# Patient Record
Sex: Male | Born: 1945 | Race: White | Hispanic: No | Marital: Single | Smoking: Former smoker
Health system: Southern US, Community
[De-identification: ages and names within clinical notes are randomized; demographics above are authoritative.]

## PROBLEM LIST (undated history)

## (undated) DIAGNOSIS — I2089 Other forms of angina pectoris: Secondary | ICD-10-CM

## (undated) DIAGNOSIS — I10 Essential (primary) hypertension: Secondary | ICD-10-CM

## (undated) DIAGNOSIS — M1990 Unspecified osteoarthritis, unspecified site: Secondary | ICD-10-CM

## (undated) DIAGNOSIS — E119 Type 2 diabetes mellitus without complications: Secondary | ICD-10-CM

## (undated) DIAGNOSIS — K219 Gastro-esophageal reflux disease without esophagitis: Secondary | ICD-10-CM

## (undated) DIAGNOSIS — E785 Hyperlipidemia, unspecified: Secondary | ICD-10-CM

## (undated) DIAGNOSIS — I208 Other forms of angina pectoris: Secondary | ICD-10-CM

## (undated) HISTORY — DX: Essential (primary) hypertension: I10

## (undated) HISTORY — DX: Other forms of angina pectoris: I20.89

## (undated) HISTORY — DX: Type 2 diabetes mellitus without complications: E11.9

## (undated) HISTORY — PX: PILONIDAL CYST EXCISION: SHX744

## (undated) HISTORY — DX: Gastro-esophageal reflux disease without esophagitis: K21.9

## (undated) HISTORY — DX: Other forms of angina pectoris: I20.8

## (undated) HISTORY — DX: Unspecified osteoarthritis, unspecified site: M19.90

## (undated) HISTORY — DX: Hyperlipidemia, unspecified: E78.5

## (undated) HISTORY — PX: KNEE ARTHROSCOPY: SUR90

---

## 2004-01-31 ENCOUNTER — Ambulatory Visit (HOSPITAL_COMMUNITY): Admission: RE | Admit: 2004-01-31 | Discharge: 2004-01-31 | Payer: Self-pay | Admitting: Orthopedic Surgery

## 2004-02-06 ENCOUNTER — Ambulatory Visit (HOSPITAL_COMMUNITY): Admission: RE | Admit: 2004-02-06 | Discharge: 2004-02-06 | Payer: Self-pay | Admitting: Orthopedic Surgery

## 2017-09-27 DIAGNOSIS — I10 Essential (primary) hypertension: Secondary | ICD-10-CM | POA: Insufficient documentation

## 2017-09-27 DIAGNOSIS — M130 Polyarthritis, unspecified: Secondary | ICD-10-CM | POA: Insufficient documentation

## 2017-09-27 DIAGNOSIS — Z79899 Other long term (current) drug therapy: Secondary | ICD-10-CM

## 2017-09-27 DIAGNOSIS — M1711 Unilateral primary osteoarthritis, right knee: Secondary | ICD-10-CM | POA: Insufficient documentation

## 2017-09-27 DIAGNOSIS — M1712 Unilateral primary osteoarthritis, left knee: Secondary | ICD-10-CM

## 2017-09-27 DIAGNOSIS — E119 Type 2 diabetes mellitus without complications: Secondary | ICD-10-CM | POA: Insufficient documentation

## 2017-09-27 DIAGNOSIS — G894 Chronic pain syndrome: Secondary | ICD-10-CM

## 2017-09-27 DIAGNOSIS — L409 Psoriasis, unspecified: Secondary | ICD-10-CM | POA: Insufficient documentation

## 2017-09-27 DIAGNOSIS — Z6836 Body mass index (BMI) 36.0-36.9, adult: Secondary | ICD-10-CM | POA: Insufficient documentation

## 2017-09-27 DIAGNOSIS — E7801 Familial hypercholesterolemia: Secondary | ICD-10-CM | POA: Insufficient documentation

## 2017-09-27 DIAGNOSIS — M170 Bilateral primary osteoarthritis of knee: Secondary | ICD-10-CM | POA: Insufficient documentation

## 2017-09-27 NOTE — Addendum Note (Signed)
Addended by: Marlyn CorporalARLTON, CATHERINE A on: 09/27/2017 11:55 AM   Modules accepted: Orders

## 2017-09-28 ENCOUNTER — Encounter: Payer: Self-pay | Admitting: Cardiovascular Disease

## 2017-09-28 ENCOUNTER — Ambulatory Visit (INDEPENDENT_AMBULATORY_CARE_PROVIDER_SITE_OTHER): Payer: Medicare Other | Admitting: Cardiovascular Disease

## 2017-09-28 ENCOUNTER — Encounter: Payer: Self-pay | Admitting: *Deleted

## 2017-09-28 VITALS — BP 180/98 | HR 110 | Ht 67.5 in | Wt 208.0 lb

## 2017-09-28 DIAGNOSIS — E7849 Other hyperlipidemia: Secondary | ICD-10-CM

## 2017-09-28 DIAGNOSIS — E119 Type 2 diabetes mellitus without complications: Secondary | ICD-10-CM | POA: Diagnosis not present

## 2017-09-28 DIAGNOSIS — I10 Essential (primary) hypertension: Secondary | ICD-10-CM | POA: Diagnosis not present

## 2017-09-28 DIAGNOSIS — R0602 Shortness of breath: Secondary | ICD-10-CM | POA: Diagnosis not present

## 2017-09-28 DIAGNOSIS — R0609 Other forms of dyspnea: Secondary | ICD-10-CM

## 2017-09-28 MED ORDER — METOPROLOL TARTRATE 25 MG PO TABS: 25.0000 mg | ORAL_TABLET | Freq: Two times a day (BID) | ORAL | 3 refills | Status: DC

## 2017-09-28 NOTE — Progress Notes (Signed)
CARDIOLOGY CONSULT NOTE  Patient ID: Dustin Monroe MRN: [id number] DOB/AGE: December 12, 1945 72 y.o.  Admit date: (Not on file) Primary Physician: Samuel Jester, DO Referring Physician: Samuel Jester, DO  Reason for Consultation: Shortness of breath  HPI: Dustin Monroe is a 72 y.o. male who is being seen today for the evaluation of  shortness of breath at the request of Samuel Jester, DO.   I reviewed records provided by Dr. Charm Barges.  He has a history of hypertension, diabetes and hyperlipidemia.    He has apparently been experiencing significant exertional dyspnea.  It appears hydrochlorothiazide 25 mg was started at a visit with his PCP on 08/29/17 but I do not see this on his medication list.  His blood pressure is significantly elevated.  I reviewed labs performed on 04/18/17 which included the following: Hemoglobin 15.6, total cholesterol 144, triglycerides 143, HDL 38, LDL 78, BUN 12, creatinine 0.9.  ECG performed today which I personally interpreted demonstrated sinus tachycardia, 104 bpm.  Upon speaking with him, he said the hydrochlorothiazide was too costly.  He is currently taking losartan 100 mg daily.  His blood pressure is 180/98.  He said when he was mowing the lawn last summer breath.  He denies any chest pain and pressure.  He becomes dyspneic with activities such as unloading 40 pound bags of pellets.  He denies orthopnea, paroxysmal nocturnal dyspnea, and leg swelling.  He did not have symptoms like this 1 year ago.  Used to walk 3 miles daily but has been limited by both exertional dyspnea and bilateral knee osteoarthritis.   Family history: His father had bypass surgery in his 36s.  His mother had a "enlarged heart "and had troubles with a "fast heart rate" which required medications.  No Known Allergies  Current Outpatient Medications  Medication Sig Dispense Refill  . aspirin EC 81 MG tablet Take 81 mg by mouth daily.    Marland Kitchen atorvastatin  (LIPITOR) 20 MG tablet Take 20 mg by mouth daily at 6 PM.    . esomeprazole (NEXIUM) 20 MG packet Take 20 mg by mouth daily before breakfast.    . hydrochlorothiazide (HYDRODIURIL) 25 MG tablet Take 25 mg by mouth daily.    Marland Kitchen HYDROcodone-acetaminophen (NORCO) 10-325 MG tablet Take 1 tablet by mouth every 8 (eight) hours as needed.    . hydrOXYzine (ATARAX/VISTARIL) 25 MG tablet Take 25 mg by mouth 3 (three) times daily as needed.    . metFORMIN (GLUCOPHAGE) 1000 MG tablet Take 1,000 mg by mouth daily after supper.    . naproxen sodium (ALEVE) 220 MG tablet Take 220 mg by mouth at bedtime. Take 2 tabs at bedtime     No current facility-administered medications for this visit.     Past Medical History:  Diagnosis Date  . Angina effort (HCC)   . DJD (degenerative joint disease)   . DM (diabetes mellitus) (HCC)   . GERD (gastroesophageal reflux disease)   . HTN (hypertension)   . Hyperlipemia     Past Surgical History:  Procedure Laterality Date  . KNEE ARTHROSCOPY Bilateral   . PILONIDAL CYST EXCISION      Social History   Socioeconomic History  . Marital status: Single    Spouse name: Not on file  . Number of children: Not on file  . Years of education: Not on file  . Highest education level: Not on file  Social Needs  . Financial resource strain: Not on file  . Food  insecurity - worry: Not on file  . Food insecurity - inability: Not on file  . Transportation needs - medical: Not on file  . Transportation needs - non-medical: Not on file  Occupational History  . Not on file  Tobacco Use  . Smoking status: Never Smoker  . Smokeless tobacco: Never Used  Substance and Sexual Activity  . Alcohol use: No    Frequency: Never  . Drug use: No  . Sexual activity: Not on file  Other Topics Concern  . Not on file  Social History Narrative  . Not on file      Current Meds  Medication Sig  . aspirin EC 81 MG tablet Take 81 mg by mouth daily.  Marland Kitchen. atorvastatin (LIPITOR) 20  MG tablet Take 20 mg by mouth daily at 6 PM.  . esomeprazole (NEXIUM) 20 MG packet Take 20 mg by mouth daily before breakfast.  . hydrochlorothiazide (HYDRODIURIL) 25 MG tablet Take 25 mg by mouth daily.  Marland Kitchen. HYDROcodone-acetaminophen (NORCO) 10-325 MG tablet Take 1 tablet by mouth every 8 (eight) hours as needed.  . hydrOXYzine (ATARAX/VISTARIL) 25 MG tablet Take 25 mg by mouth 3 (three) times daily as needed.  . metFORMIN (GLUCOPHAGE) 1000 MG tablet Take 1,000 mg by mouth daily after supper.  . naproxen sodium (ALEVE) 220 MG tablet Take 220 mg by mouth at bedtime. Take 2 tabs at bedtime      Review of systems complete and found to be negative unless listed above in HPI    Physical exam Blood pressure (!) 180/98, pulse (!) 110, height 5' 7.5" (1.715 m), weight 208 lb (94.3 kg), SpO2 97 %. General: NAD Neck: No JVD, no thyromegaly or thyroid nodule.  Lungs: Clear to auscultation bilaterally with normal respiratory effort. CV: Nondisplaced PMI. Mildly tachycardic, regular rhythm, normal S1/S2, no S3/S4, no murmur.  No peripheral edema.  No carotid bruit.    Abdomen: Soft, nontender, no distention.  Skin: Intact without lesions or rashes.  Neurologic: Alert and oriented x 3.  Psych: Normal affect. Extremities: No clubbing or cyanosis.  HEENT: Normal.   ECG: Most recent ECG reviewed.   Labs: No results found for: K, BUN, CREATININE, ALT, TSH, HGB   Lipids: No results found for: LDLCALC, LDLDIRECT, CHOL, TRIG, HDL      ASSESSMENT AND PLAN:  1.  Progressive exertional dyspnea: He quit smoking over 40 years ago so I doubt pulmonary disease is playing a role.  Symptoms may be related to inappropriate sinus tachycardia.  I will start metoprolol 25 mg twice daily.  I will also obtain an exercise Myoview stress test to evaluate for ischemic heart disease.  I will obtain an echocardiogram to evaluate cardiac structure and function.  2.  Hypertension: Blood pressure is severely elevated.   He is on losartan 100 mg daily.  He has been prescribed hydrochlorothiazide but said it was too costly.  I told him to double check on this as it is a generic medication.  As stated above, I am starting metoprolol 25 mg twice daily to lower his heart rate and potentially alleviate symptoms.  This may help reduce blood pressure to some degree.  I will monitor this.  3.  Hyperlipidemia: Lipids reviewed above.  Continue Lipitor 20 mg.  4.  Type 2 diabetes mellitus: He is currently on metformin.     Disposition: Follow up in 6 weeks.  Signed: Prentice DockerSuresh Koneswaran, M.D., F.A.C.C.  09/28/2017, 2:16 PM

## 2017-09-28 NOTE — Patient Instructions (Signed)
Medication Instructions:  Your physician has recommended you make the following change in your medication:  Start Metoprolol Tartrate 25 mg Two Times Days.    Labwork: NONE   Testing/Procedures: Your physician has requested that you have an echocardiogram. Echocardiography is a painless test that uses sound waves to create images of your heart. It provides your doctor with information about the size and shape of your heart and how well your heart's chambers and valves are working. This procedure takes approximately one hour. There are no restrictions for this procedure.  Your physician has requested that you have en exercise stress myoview. For further information please visit [email or web address]. Please follow instruction sheet, as given.    Follow-Up: Your physician recommends that you schedule a follow-up appointment in: 6 Weeks    Any Other Special Instructions Will Be Listed Below (If Applicable).     If you need a refill on your cardiac medications before your next appointment, please call your pharmacy.

## 2017-10-06 ENCOUNTER — Encounter (HOSPITAL_COMMUNITY)
Admission: RE | Admit: 2017-10-06 | Discharge: 2017-10-06 | Disposition: A | Discharge status: Home / Self Care | Payer: Medicare Other | Source: Ambulatory Visit | Attending: Cardiovascular Disease | Admitting: Cardiovascular Disease

## 2017-10-06 ENCOUNTER — Ambulatory Visit (HOSPITAL_BASED_OUTPATIENT_CLINIC_OR_DEPARTMENT_OTHER)
Admission: RE | Admit: 2017-10-06 | Discharge: 2017-10-06 | Disposition: A | Discharge status: Home / Self Care | Payer: Medicare Other | Source: Ambulatory Visit | Attending: Cardiovascular Disease | Admitting: Cardiovascular Disease

## 2017-10-06 ENCOUNTER — Encounter (HOSPITAL_COMMUNITY): Payer: Self-pay

## 2017-10-06 ENCOUNTER — Encounter (HOSPITAL_BASED_OUTPATIENT_CLINIC_OR_DEPARTMENT_OTHER)
Admission: RE | Admit: 2017-10-06 | Discharge: 2017-10-06 | Disposition: A | Discharge status: Home / Self Care | Payer: Medicare Other | Source: Ambulatory Visit | Attending: Cardiovascular Disease | Admitting: Cardiovascular Disease

## 2017-10-06 DIAGNOSIS — I358 Other nonrheumatic aortic valve disorders: Secondary | ICD-10-CM | POA: Insufficient documentation

## 2017-10-06 DIAGNOSIS — R0609 Other forms of dyspnea: Secondary | ICD-10-CM | POA: Diagnosis not present

## 2017-10-06 DIAGNOSIS — E119 Type 2 diabetes mellitus without complications: Secondary | ICD-10-CM

## 2017-10-06 DIAGNOSIS — I119 Hypertensive heart disease without heart failure: Secondary | ICD-10-CM

## 2017-10-06 DIAGNOSIS — E7801 Familial hypercholesterolemia: Secondary | ICD-10-CM

## 2017-10-06 LAB — NM MYOCAR MULTI W/SPECT W/WALL MOTION / EF
CHL CUP MPHR: 150 {beats}/min
CHL CUP NUCLEAR SRS: 0
CHL CUP NUCLEAR SSS: 2
CHL CUP RESTING HR STRESS: 83 {beats}/min
CHL RATE OF PERCEIVED EXERTION: 12
CSEPED: 4 min
CSEPEDS: 1 s
CSEPHR: 96 %
Estimated workload: 4.6 METS
LV dias vol: 72 mL (ref 62–150)
LV sys vol: 24 mL
Peak HR: 144 {beats}/min
RATE: 0.31
SDS: 2
TID: 0.92

## 2017-10-06 LAB — ECHOCARDIOGRAM COMPLETE
AVLVOTPG: 5 mmHg
CHL CUP DOP CALC LVOT VTI: 19.6 cm
CHL CUP TV REG PEAK VELOCITY: 243 cm/s
E decel time: 282 msec
E/e' ratio: 8.36
FS: 30 % (ref 28–44)
IVS/LV PW RATIO, ED: 0.95
LA ID, A-P, ES: 26 mm
LA diam end sys: 26 mm
LA vol index: 14.2 mL/m2
LADIAMINDEX: 1.21 cm/m2
LAVOL: 30.6 mL
LAVOLA4C: 29.1 mL
LV E/e' medial: 8.36
LV SIMPSON'S DISK: 60
LV TDI E'LATERAL: 7.07
LV TDI E'MEDIAL: 8.49
LV dias vol index: 25 mL/m2
LV dias vol: 54 mL — AB (ref 62–150)
LV e' LATERAL: 7.07 cm/s
LV sys vol index: 10 mL/m2
LV sys vol: 21 mL (ref 21–61)
LVEEAVG: 8.36
LVOT SV: 62 mL
LVOT area: 3.14 cm2
LVOT diameter: 20 mm
LVOT peak vel: 115 cm/s
Lateral S' vel: 14.7 cm/s
MV Dec: 282
MVPKAVEL: 96.3 m/s
MVPKEVEL: 59.1 m/s
PW: 15.3 mm — AB (ref 0.6–1.1)
RV TAPSE: 17.4 mm
RV sys press: 27 mmHg
Stroke v: 32 ml
TRMAXVEL: 243 cm/s

## 2017-10-06 MED ORDER — TECHNETIUM TC 99M TETROFOSMIN IV KIT
10.0000 | PACK | Freq: Once | INTRAVENOUS | Status: AC | PRN
  Administered 2017-10-06: 11 via INTRAVENOUS

## 2017-10-06 MED ORDER — SODIUM CHLORIDE 0.9% FLUSH
INTRAVENOUS | Status: AC
  Administered 2017-10-06: 10 mL via INTRAVENOUS
  Filled 2017-10-06: qty 10

## 2017-10-06 MED ORDER — TECHNETIUM TC 99M TETROFOSMIN IV KIT
30.0000 | PACK | Freq: Once | INTRAVENOUS | Status: AC | PRN
  Administered 2017-10-06: 31 via INTRAVENOUS

## 2017-10-06 MED ORDER — REGADENOSON 0.4 MG/5ML IV SOLN
INTRAVENOUS | Status: AC
  Filled 2017-10-06: qty 5

## 2017-10-06 NOTE — Progress Notes (Signed)
*  PRELIMINARY RESULTS* Echocardiogram 2D Echocardiogram has been performed.  Stacey DrainWhite, Shonnie Poudrier J 10/06/2017, 11:27 AM

## 2017-11-14 ENCOUNTER — Encounter: Payer: Self-pay | Admitting: Cardiovascular Disease

## 2017-11-14 ENCOUNTER — Ambulatory Visit (INDEPENDENT_AMBULATORY_CARE_PROVIDER_SITE_OTHER): Payer: Medicare Other | Admitting: Cardiovascular Disease

## 2017-11-14 VITALS — BP 194/100 | HR 74 | Ht 67.5 in | Wt 211.6 lb

## 2017-11-14 DIAGNOSIS — E119 Type 2 diabetes mellitus without complications: Secondary | ICD-10-CM

## 2017-11-14 DIAGNOSIS — I10 Essential (primary) hypertension: Secondary | ICD-10-CM | POA: Diagnosis not present

## 2017-11-14 DIAGNOSIS — E7849 Other hyperlipidemia: Secondary | ICD-10-CM

## 2017-11-14 DIAGNOSIS — R000 Tachycardia, unspecified: Secondary | ICD-10-CM | POA: Diagnosis not present

## 2017-11-14 DIAGNOSIS — R0609 Other forms of dyspnea: Secondary | ICD-10-CM | POA: Diagnosis not present

## 2017-11-14 MED ORDER — AMLODIPINE BESYLATE 5 MG PO TABS: 5.0000 mg | ORAL_TABLET | Freq: Every day | ORAL | 3 refills | Status: AC

## 2017-11-14 NOTE — Patient Instructions (Signed)
Your physician recommends that you schedule a follow-up appointment in: 3 months with Dr Purvis SheffieldKoneswaran    START Amlodipine 5 mg daily    All other medications stay the same     No lab work or tests ordered today       Thank you for choosing [facility] !

## 2017-11-14 NOTE — Progress Notes (Signed)
SUBJECTIVE: The patient returns for follow-up after undergoing cardiovascular testing performed for the evaluation of shortness of breath.  Nuclear stress test on 10/06/17 was low risk with no evidence of myocardial ischemia or scar.  Echocardiogram demonstrated normal left ventricular systolic function, LVEF 55-60%, with grade 1 diastolic dysfunction.  He is feeling better with respect to shortness of breath.  Before he was unable to lift 40 pound bags of pellets but now has had no issues with this whatsoever.  He checks his blood pressure at home with a wrist monitor on a daily basis with pressures ranging from 150-160/80-90.  He denies chest pain.       Review of Systems: As per "subjective", otherwise negative.  No Known Allergies  Current Outpatient Medications  Medication Sig Dispense Refill  . aspirin EC 81 MG tablet Take 81 mg by mouth daily.    Marland Kitchen atorvastatin (LIPITOR) 20 MG tablet Take 20 mg by mouth daily at 6 PM.    . esomeprazole (NEXIUM) 20 MG packet Take 20 mg by mouth daily before breakfast.    . hydrochlorothiazide (HYDRODIURIL) 25 MG tablet Take 25 mg by mouth daily.    Marland Kitchen HYDROcodone-acetaminophen (NORCO) 10-325 MG tablet Take 1 tablet by mouth every 8 (eight) hours as needed.    . hydrOXYzine (ATARAX/VISTARIL) 25 MG tablet Take 25 mg by mouth 3 (three) times daily as needed.    Marland Kitchen losartan (COZAAR) 100 MG tablet Take 1 tablet by mouth daily.    . metFORMIN (GLUCOPHAGE) 1000 MG tablet Take 1,000 mg by mouth daily after supper.    . metoprolol tartrate (LOPRESSOR) 25 MG tablet Take 1 tablet (25 mg total) by mouth 2 (two) times daily. 180 tablet 3  . naproxen sodium (ALEVE) 220 MG tablet Take 220 mg by mouth at bedtime. Take 2 tabs at bedtime     No current facility-administered medications for this visit.     Past Medical History:  Diagnosis Date  . Angina effort (HCC)   . DJD (degenerative joint disease)   . DM (diabetes mellitus) (HCC)   . GERD  (gastroesophageal reflux disease)   . HTN (hypertension)   . Hyperlipemia     Past Surgical History:  Procedure Laterality Date  . KNEE ARTHROSCOPY Bilateral   . PILONIDAL CYST EXCISION      Social History   Socioeconomic History  . Marital status: Single    Spouse name: Not on file  . Number of children: Not on file  . Years of education: Not on file  . Highest education level: Not on file  Social Needs  . Financial resource strain: Not on file  . Food insecurity - worry: Not on file  . Food insecurity - inability: Not on file  . Transportation needs - medical: Not on file  . Transportation needs - non-medical: Not on file  Occupational History  . Not on file  Tobacco Use  . Smoking status: Never Smoker  . Smokeless tobacco: Never Used  Substance and Sexual Activity  . Alcohol use: No    Frequency: Never  . Drug use: No  . Sexual activity: Not on file  Other Topics Concern  . Not on file  Social History Narrative  . Not on file     Vitals:   11/14/17 1527 11/14/17 1534  BP: (!) 190/110 (!) 194/100  Pulse: 77 74  SpO2: 96% 96%  Weight: 211 lb 9.6 oz (96 kg)   Height: 5' 7.5" (1.715  m)     Wt Readings from Last 3 Encounters:  11/14/17 211 lb 9.6 oz (96 kg)  09/28/17 208 lb (94.3 kg)     PHYSICAL EXAM General: NAD HEENT: Normal. Neck: No JVD, no thyromegaly. Lungs: Clear to auscultation bilaterally with normal respiratory effort. CV: Regular rate and rhythm, normal S1/S2, no S3/S4, no murmur. No pretibial or periankle edema.   Abdomen: Soft, nontender, no distention.  Neurologic: Alert and oriented.  Psych: Normal affect. Skin: Normal. Musculoskeletal: No gross deformities.    ECG: Most recent ECG reviewed.   Labs: No results found for: K, BUN, CREATININE, ALT, TSH, HGB   Lipids: No results found for: LDLCALC, LDLDIRECT, CHOL, TRIG, HDL     ASSESSMENT AND PLAN:  1.  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  I previously initiated metoprolol 25 mg twice daily.  Nuclear stress test was low risk as detailed above with normal left ventricular systolic function.  2.    Accelerated hypertension: Blood pressure is severely elevated.  He is on losartan 100 mg daily, hydrochlorothiazide 25 mg daily, and metoprolol 25 mg twice daily.  I will start amlodipine 5 mg daily.  3.  Hyperlipidemia: Lipids previously reviewed.  Continue Lipitor 20 mg.  4.  Type 2 diabetes mellitus: He is currently on metformin.     Disposition: Follow up 3 months   Prentice DockerSuresh Quandre Polinski, M.D., F.A.C.C.

## 2017-11-24 ENCOUNTER — Other Ambulatory Visit (HOSPITAL_COMMUNITY): Payer: Self-pay | Admitting: *Deleted

## 2017-11-24 ENCOUNTER — Other Ambulatory Visit: Payer: Self-pay | Admitting: *Deleted

## 2017-11-24 DIAGNOSIS — E119 Type 2 diabetes mellitus without complications: Secondary | ICD-10-CM

## 2017-11-24 DIAGNOSIS — R1011 Right upper quadrant pain: Secondary | ICD-10-CM

## 2017-12-08 ENCOUNTER — Ambulatory Visit (HOSPITAL_COMMUNITY)
Admission: RE | Admit: 2017-12-08 | Discharge: 2017-12-08 | Disposition: A | Discharge status: Home / Self Care | Payer: Medicare Other | Source: Ambulatory Visit | Attending: *Deleted | Admitting: *Deleted

## 2017-12-08 DIAGNOSIS — N200 Calculus of kidney: Secondary | ICD-10-CM | POA: Insufficient documentation

## 2017-12-08 DIAGNOSIS — I700 Atherosclerosis of aorta: Secondary | ICD-10-CM | POA: Diagnosis not present

## 2017-12-08 DIAGNOSIS — R1011 Right upper quadrant pain: Secondary | ICD-10-CM | POA: Diagnosis present

## 2017-12-08 DIAGNOSIS — E119 Type 2 diabetes mellitus without complications: Secondary | ICD-10-CM

## 2018-02-14 ENCOUNTER — Encounter: Payer: Self-pay | Admitting: Cardiovascular Disease

## 2018-02-14 ENCOUNTER — Ambulatory Visit (INDEPENDENT_AMBULATORY_CARE_PROVIDER_SITE_OTHER): Payer: Medicare Other | Admitting: Cardiovascular Disease

## 2018-02-14 VITALS — BP 142/90 | HR 87 | Ht 65.0 in | Wt 214.0 lb

## 2018-02-14 DIAGNOSIS — R0609 Other forms of dyspnea: Secondary | ICD-10-CM

## 2018-02-14 DIAGNOSIS — E119 Type 2 diabetes mellitus without complications: Secondary | ICD-10-CM

## 2018-02-14 DIAGNOSIS — E7849 Other hyperlipidemia: Secondary | ICD-10-CM | POA: Diagnosis not present

## 2018-02-14 DIAGNOSIS — R000 Tachycardia, unspecified: Secondary | ICD-10-CM | POA: Diagnosis not present

## 2018-02-14 DIAGNOSIS — I10 Essential (primary) hypertension: Secondary | ICD-10-CM | POA: Diagnosis not present

## 2018-02-14 NOTE — Progress Notes (Signed)
SUBJECTIVE: The patient presents for follow-up of inappropriate sinus tachycardia and accelerated hypertension.  The patient denies any symptoms of chest pain, palpitations, shortness of breath, lightheadedness, dizziness, leg swelling, orthopnea, PND, and syncope.  He tries to avoid being outdoors when it is very humid.  He has been checking his blood pressure 3 times per day and averages in the 116/70 range.  He said he feels better overall.    Review of Systems: As per "subjective", otherwise negative.  No Known Allergies  Current Outpatient Medications  Medication Sig Dispense Refill  . aspirin EC 81 MG tablet Take 81 mg by mouth daily.    Marland Kitchen. atorvastatin (LIPITOR) 20 MG tablet Take 20 mg by mouth daily at 6 PM.    . esomeprazole (NEXIUM) 20 MG packet Take 20 mg by mouth daily before breakfast.    . hydrochlorothiazide (HYDRODIURIL) 25 MG tablet Take 25 mg by mouth daily.    Marland Kitchen. HYDROcodone-acetaminophen (NORCO) 10-325 MG tablet Take 1 tablet by mouth every 8 (eight) hours as needed.    . hydrOXYzine (ATARAX/VISTARIL) 25 MG tablet Take 25 mg by mouth 3 (three) times daily as needed.    Marland Kitchen. losartan (COZAAR) 100 MG tablet Take 1 tablet by mouth daily.    . metFORMIN (GLUCOPHAGE) 1000 MG tablet Take 1,000 mg by mouth daily after supper.    . metoprolol tartrate (LOPRESSOR) 25 MG tablet Take 1 tablet (25 mg total) by mouth 2 (two) times daily. 180 tablet 3  . naproxen sodium (ALEVE) 220 MG tablet Take 220 mg by mouth at bedtime. Take 2 tabs at bedtime    . amLODipine (NORVASC) 5 MG tablet Take 1 tablet (5 mg total) by mouth daily. 90 tablet 3   No current facility-administered medications for this visit.     Past Medical History:  Diagnosis Date  . Angina effort   . DJD (degenerative joint disease)   . DM (diabetes mellitus) (HCC)   . GERD (gastroesophageal reflux disease)   . HTN (hypertension)   . Hyperlipemia     Past Surgical History:  Procedure Laterality Date    . KNEE ARTHROSCOPY Bilateral   . PILONIDAL CYST EXCISION      Social History   Socioeconomic History  . Marital status: Single    Spouse name: Not on file  . Number of children: Not on file  . Years of education: Not on file  . Highest education level: Not on file  Occupational History  . Not on file  Social Needs  . Financial resource strain: Not on file  . Food insecurity:    Worry: Not on file    Inability: Not on file  . Transportation needs:    Medical: Not on file    Non-medical: Not on file  Tobacco Use  . Smoking status: Never Smoker  . Smokeless tobacco: Never Used  Substance and Sexual Activity  . Alcohol use: No    Frequency: Never  . Drug use: No  . Sexual activity: Not on file  Lifestyle  . Physical activity:    Days per week: Not on file    Minutes per session: Not on file  . Stress: Not on file  Relationships  . Social connections:    Talks on phone: Not on file    Gets together: Not on file    Attends religious service: Not on file    Active member of club or organization: Not on file    Attends  meetings of clubs or organizations: Not on file    Relationship status: Not on file  . Intimate partner violence:    Fear of current or ex partner: Not on file    Emotionally abused: Not on file    Physically abused: Not on file    Forced sexual activity: Not on file  Other Topics Concern  . Not on file  Social History Narrative  . Not on file     Vitals:   02/14/18 0827  BP: (!) 142/90  Pulse: 87  SpO2: 97%  Weight: 214 lb (97.1 kg)  Height: 5\' 5"  (1.651 m)    Wt Readings from Last 3 Encounters:  02/14/18 214 lb (97.1 kg)  11/14/17 211 lb 9.6 oz (96 kg)  09/28/17 208 lb (94.3 kg)     PHYSICAL EXAM General: NAD HEENT: Normal. Neck: No JVD, no thyromegaly. Lungs: Clear to auscultation bilaterally with normal respiratory effort. CV: Regular rate and rhythm, normal S1/S2, no S3/S4, no murmur. No pretibial or periankle edema.     Abdomen: Soft, nontender, no distention.  Neurologic: Alert and oriented.  Psych: Normal affect. Skin: Normal. Musculoskeletal: No gross deformities.    ECG: Most recent ECG reviewed.   Labs: No results found for: K, BUN, CREATININE, ALT, TSH, HGB   Lipids: No results found for: LDLCALC, LDLDIRECT, CHOL, TRIG, HDL     ASSESSMENT AND PLAN: 1. 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  Symptoms may have been related to inappropriate sinus tachycardia. I previously initiated metoprolol 25 mg twice daily.  Nuclear stress test was low risk on 10/06/17 with normal left ventricular systolic function.  2.   Accelerated hypertension: Blood pressure is mildly elevated in the office today but he checks it routinely at home and it remains controlled.  This is a more accurate assessment of his overall blood pressure control.  I recommended he check his blood pressure a few times per week and not 3 times per day. He is on losartan 100 mg daily, hydrochlorothiazide 25 mg daily, amlodipine 5 mg daily, and metoprolol 25 mg twice daily.  No changes to therapy.  3. Hyperlipidemia: Lipids previously reviewed. Continue Lipitor 20 mg.  4. Type 2 diabetes mellitus: He is currently on metformin.     Disposition: Follow up 1 year   Prentice Docker, M.D., F.A.C.C.

## 2018-02-14 NOTE — Patient Instructions (Signed)
Your physician wants you to follow-up in:  1 year with Dr.Koneswaran You will receive a reminder letter in the mail two months in advance. If you don't receive a letter, please call our office to schedule the follow-up appointment.    Your physician recommends that you continue on your current medications as directed. Please refer to the Current Medication list given to you today.    If you need a refill on your cardiac medications before your next appointment, please call your pharmacy.      No lab work or tests ordered today.      Thank you for choosing [facility] !        

## 2018-09-16 ENCOUNTER — Other Ambulatory Visit: Payer: Self-pay | Admitting: Cardiovascular Disease

## 2019-03-20 ENCOUNTER — Telehealth (INDEPENDENT_AMBULATORY_CARE_PROVIDER_SITE_OTHER): Payer: Medicare Other | Admitting: Cardiovascular Disease

## 2019-03-20 ENCOUNTER — Encounter: Payer: Self-pay | Admitting: Cardiovascular Disease

## 2019-03-20 VITALS — BP 119/79 | HR 70 | Ht 65.0 in | Wt 200.0 lb

## 2019-03-20 DIAGNOSIS — E119 Type 2 diabetes mellitus without complications: Secondary | ICD-10-CM

## 2019-03-20 DIAGNOSIS — R000 Tachycardia, unspecified: Secondary | ICD-10-CM | POA: Diagnosis not present

## 2019-03-20 DIAGNOSIS — E785 Hyperlipidemia, unspecified: Secondary | ICD-10-CM

## 2019-03-20 DIAGNOSIS — R0609 Other forms of dyspnea: Secondary | ICD-10-CM

## 2019-03-20 DIAGNOSIS — I10 Essential (primary) hypertension: Secondary | ICD-10-CM

## 2019-03-20 NOTE — Progress Notes (Signed)
Virtual Visit via Telephone Note   This visit type was conducted due to national recommendations for restrictions regarding the COVID-19 Pandemic (e.g. social distancing) in an effort to limit this patient's exposure and mitigate transmission in our community.  Due to his co-morbid illnesses, this patient is at least at moderate risk for complications without adequate follow up.  This format is felt to be most appropriate for this patient at this time.  The patient did not have access to video technology/had technical difficulties with video requiring transitioning to audio format only (telephone).  All issues noted in this document were discussed and addressed.  No physical exam could be performed with this format.  Please refer to the patient's chart for his  consent to telehealth for [facility].   Date:  03/20/2019   ID:  Dustin Monroe, DOB 05-19-1946, MRN [id number]  Patient Location: Home Provider Location: Office  PCP:  Samuel JesterButler, Cynthia, DO  Cardiologist:  Prentice DockerSuresh , MD  Electrophysiologist:  None   Evaluation Performed:  Follow-Up Visit  Chief Complaint:  inappropriate sinus tachycardia and accelerated hypertension.  History of Present Illness:    Dustin Monroe is a 73 y.o. male with inappropriate sinus tachycardia and accelerated hypertension.  The patient denies any symptoms of chest pain, palpitations, shortness of breath, lightheadedness, dizziness, leg swelling, orthopnea, PND, and syncope.  He occasionally goes for a walk.  He watches a lot of TV.  The patient does not have symptoms concerning for COVID-19 infection (fever, chills, cough, or new shortness of breath).    Past Medical History:  Diagnosis Date  . Angina effort   . DJD (degenerative joint disease)   . DM (diabetes mellitus) (HCC)   . GERD (gastroesophageal reflux disease)   . HTN (hypertension)   . Hyperlipemia    Past Surgical History:  Procedure Laterality Date  . KNEE  ARTHROSCOPY Bilateral   . PILONIDAL CYST EXCISION       Current Meds  Medication Sig  . amLODipine (NORVASC) 5 MG tablet Take 1 tablet (5 mg total) by mouth daily.  Marland Kitchen. aspirin EC 81 MG tablet Take 81 mg by mouth daily.  Marland Kitchen. atorvastatin (LIPITOR) 20 MG tablet Take 20 mg by mouth daily at 6 PM.  . esomeprazole (NEXIUM) 20 MG packet Take 20 mg by mouth daily before breakfast.  . hydrochlorothiazide (HYDRODIURIL) 25 MG tablet Take 25 mg by mouth daily.  Marland Kitchen. HYDROcodone-acetaminophen (NORCO) 10-325 MG tablet Take 1 tablet by mouth every 8 (eight) hours as needed.  . hydrOXYzine (ATARAX/VISTARIL) 25 MG tablet Take 25 mg by mouth 3 (three) times daily as needed.  Marland Kitchen. losartan (COZAAR) 100 MG tablet Take 1 tablet by mouth daily.  . metFORMIN (GLUCOPHAGE) 1000 MG tablet Take 1,000 mg by mouth daily after supper.  . metoprolol tartrate (LOPRESSOR) 25 MG tablet TAKE 1 TABLET BY MOUTH TWICE DAILY  . naproxen sodium (ALEVE) 220 MG tablet Take 220 mg by mouth at bedtime. Take 2 tabs at bedtime     Allergies:   Patient has no known allergies.   Social History   Tobacco Use  . Smoking status: Former Smoker    Years: 15.00  . Smokeless tobacco: Never Used  Substance Use Topics  . Alcohol use: No    Frequency: Never  . Drug use: No     Family Hx: The patient's family history includes Diabetes in his father and mother; Hypertension in his brother, brother, brother, father, mother, sister, and sister; Prostate cancer  in his father.  ROS:   Please see the history of present illness.     All other systems reviewed and are negative.   Prior CV studies:   The following studies were reviewed today:  See below  Labs/Other Tests and Data Reviewed:    EKG:  No ECG reviewed.  Recent Labs: No results found for requested labs within last 8760 hours.   Recent Lipid Panel No results found for: CHOL, TRIG, HDL, CHOLHDL, LDLCALC, LDLDIRECT  Wt Readings from Last 3 Encounters:  03/20/19 200 lb (90.7  kg)  02/14/18 214 lb (97.1 kg)  11/14/17 211 lb 9.6 oz (96 kg)     Objective:    Vital Signs:  BP 119/79   Pulse 70   Ht 5\' 5"  (1.651 m)   Wt 200 lb (90.7 kg)   BMI 33.28 kg/m    VITAL SIGNS:  reviewed  ASSESSMENT & PLAN:    1. Exertional dyspnea/inappropriate sinus tachycardia: Symptomatically stable.  Prior symptoms may have been related to inappropriate sinus tachycardia. Continuemetoprolol 25 mg twice daily. Nuclear stress test was low risk on 10/06/17 with normal left ventricular systolic function.  2.Hypertension: Blood pressure is normal. He is on losartan 100 mg daily, hydrochlorothiazide 25 mg daily, amlodipine 5 mg daily, and metoprolol 25 mg twice daily.  No changes to therapy.  3. Hyperlipidemia: Continue Lipitor 20 mg.  4. Type 2 diabetes mellitus: He is currently on metformin.   COVID-19 Education: The signs and symptoms of COVID-19 were discussed with the patient and how to seek care for testing (follow up with PCP or arrange E-visit).  The importance of social distancing was discussed today.  Time:   Today, I have spent 5 minutes with the patient with telehealth technology discussing the above problems.     Medication Adjustments/Labs and Tests Ordered: Current medicines are reviewed at length with the patient today.  Concerns regarding medicines are outlined above.   Tests Ordered: No orders of the defined types were placed in this encounter.   Medication Changes: No orders of the defined types were placed in this encounter.   Follow Up:  Virtual Visit or In Person in 1 year(s)  Signed, Kate Sable, MD  03/20/2019 10:33 AM    [place]

## 2019-03-20 NOTE — Patient Instructions (Signed)

## 2020-02-21 ENCOUNTER — Other Ambulatory Visit: Payer: Self-pay | Admitting: Cardiovascular Disease

## 2020-10-13 ENCOUNTER — Other Ambulatory Visit: Payer: Self-pay | Admitting: *Deleted

## 2020-10-13 MED ORDER — METOPROLOL TARTRATE 25 MG PO TABS: 25.0000 mg | ORAL_TABLET | Freq: Two times a day (BID) | ORAL | 0 refills | Status: DC

## 2020-12-10 ENCOUNTER — Other Ambulatory Visit: Payer: Self-pay | Admitting: Family

## 2020-12-10 ENCOUNTER — Other Ambulatory Visit (HOSPITAL_COMMUNITY): Payer: Self-pay | Admitting: Family

## 2020-12-10 DIAGNOSIS — R1011 Right upper quadrant pain: Secondary | ICD-10-CM

## 2020-12-18 ENCOUNTER — Ambulatory Visit (HOSPITAL_COMMUNITY)
Admission: RE | Admit: 2020-12-18 | Discharge: 2020-12-18 | Disposition: A | Discharge status: Home / Self Care | Payer: Medicare Other | Source: Ambulatory Visit | Attending: Family | Admitting: Family

## 2020-12-18 DIAGNOSIS — R1011 Right upper quadrant pain: Secondary | ICD-10-CM | POA: Insufficient documentation

## 2021-03-02 ENCOUNTER — Other Ambulatory Visit: Payer: Self-pay | Admitting: Family Medicine

## 2021-06-10 ENCOUNTER — Encounter: Payer: Self-pay | Admitting: Gastroenterology

## 2021-06-10 ENCOUNTER — Other Ambulatory Visit (HOSPITAL_COMMUNITY): Payer: Self-pay | Admitting: Family

## 2021-06-10 DIAGNOSIS — W19XXXA Unspecified fall, initial encounter: Secondary | ICD-10-CM

## 2021-06-10 DIAGNOSIS — E119 Type 2 diabetes mellitus without complications: Secondary | ICD-10-CM

## 2021-06-10 DIAGNOSIS — Y92009 Unspecified place in unspecified non-institutional (private) residence as the place of occurrence of the external cause: Secondary | ICD-10-CM

## 2021-06-19 ENCOUNTER — Other Ambulatory Visit: Payer: Self-pay

## 2021-06-19 ENCOUNTER — Ambulatory Visit (HOSPITAL_COMMUNITY)
Admission: RE | Admit: 2021-06-19 | Discharge: 2021-06-19 | Disposition: A | Discharge status: Home / Self Care | Payer: Medicare Other | Source: Ambulatory Visit | Attending: Family | Admitting: Family

## 2021-06-19 DIAGNOSIS — W19XXXA Unspecified fall, initial encounter: Secondary | ICD-10-CM | POA: Diagnosis not present

## 2021-06-19 DIAGNOSIS — Y92009 Unspecified place in unspecified non-institutional (private) residence as the place of occurrence of the external cause: Secondary | ICD-10-CM | POA: Insufficient documentation

## 2021-06-19 DIAGNOSIS — E119 Type 2 diabetes mellitus without complications: Secondary | ICD-10-CM | POA: Diagnosis not present

## 2021-08-21 ENCOUNTER — Inpatient Hospital Stay (HOSPITAL_COMMUNITY)
Admission: EM | Admit: 2021-08-21 | Discharge: 2021-08-24 | DRG: 872 | Disposition: A | Discharge status: Home / Self Care | Payer: Medicare Other | Attending: Family Medicine | Admitting: Family Medicine

## 2021-08-21 ENCOUNTER — Encounter (HOSPITAL_COMMUNITY): Payer: Self-pay | Admitting: *Deleted

## 2021-08-21 ENCOUNTER — Emergency Department (HOSPITAL_COMMUNITY): Payer: Medicare Other

## 2021-08-21 ENCOUNTER — Other Ambulatory Visit: Payer: Self-pay

## 2021-08-21 DIAGNOSIS — R000 Tachycardia, unspecified: Secondary | ICD-10-CM | POA: Diagnosis present

## 2021-08-21 DIAGNOSIS — Z8249 Family history of ischemic heart disease and other diseases of the circulatory system: Secondary | ICD-10-CM

## 2021-08-21 DIAGNOSIS — R748 Abnormal levels of other serum enzymes: Secondary | ICD-10-CM | POA: Diagnosis present

## 2021-08-21 DIAGNOSIS — A419 Sepsis, unspecified organism: Principal | ICD-10-CM | POA: Diagnosis present

## 2021-08-21 DIAGNOSIS — G894 Chronic pain syndrome: Secondary | ICD-10-CM | POA: Diagnosis present

## 2021-08-21 DIAGNOSIS — E872 Acidosis, unspecified: Secondary | ICD-10-CM | POA: Diagnosis present

## 2021-08-21 DIAGNOSIS — N179 Acute kidney failure, unspecified: Secondary | ICD-10-CM | POA: Diagnosis present

## 2021-08-21 DIAGNOSIS — K219 Gastro-esophageal reflux disease without esophagitis: Secondary | ICD-10-CM | POA: Diagnosis present

## 2021-08-21 DIAGNOSIS — K759 Inflammatory liver disease, unspecified: Secondary | ICD-10-CM

## 2021-08-21 DIAGNOSIS — E78019 Familial hypercholesterolemia, unspecified: Secondary | ICD-10-CM | POA: Diagnosis present

## 2021-08-21 DIAGNOSIS — R1013 Epigastric pain: Secondary | ICD-10-CM | POA: Diagnosis present

## 2021-08-21 DIAGNOSIS — B179 Acute viral hepatitis, unspecified: Secondary | ICD-10-CM | POA: Diagnosis present

## 2021-08-21 DIAGNOSIS — M130 Polyarthritis, unspecified: Secondary | ICD-10-CM | POA: Diagnosis present

## 2021-08-21 DIAGNOSIS — R109 Unspecified abdominal pain: Secondary | ICD-10-CM | POA: Diagnosis present

## 2021-08-21 DIAGNOSIS — I10 Essential (primary) hypertension: Secondary | ICD-10-CM

## 2021-08-21 DIAGNOSIS — M170 Bilateral primary osteoarthritis of knee: Secondary | ICD-10-CM | POA: Diagnosis present

## 2021-08-21 DIAGNOSIS — T43215A Adverse effect of selective serotonin and norepinephrine reuptake inhibitors, initial encounter: Secondary | ICD-10-CM | POA: Diagnosis present

## 2021-08-21 DIAGNOSIS — Z7982 Long term (current) use of aspirin: Secondary | ICD-10-CM

## 2021-08-21 DIAGNOSIS — R509 Fever, unspecified: Secondary | ICD-10-CM | POA: Diagnosis present

## 2021-08-21 DIAGNOSIS — Z833 Family history of diabetes mellitus: Secondary | ICD-10-CM

## 2021-08-21 DIAGNOSIS — E7801 Familial hypercholesterolemia: Secondary | ICD-10-CM | POA: Diagnosis present

## 2021-08-21 DIAGNOSIS — Z20822 Contact with and (suspected) exposure to covid-19: Secondary | ICD-10-CM | POA: Diagnosis present

## 2021-08-21 DIAGNOSIS — E119 Type 2 diabetes mellitus without complications: Secondary | ICD-10-CM

## 2021-08-21 DIAGNOSIS — Y929 Unspecified place or not applicable: Secondary | ICD-10-CM

## 2021-08-21 DIAGNOSIS — K712 Toxic liver disease with acute hepatitis: Secondary | ICD-10-CM | POA: Diagnosis present

## 2021-08-21 DIAGNOSIS — Z79899 Other long term (current) drug therapy: Secondary | ICD-10-CM

## 2021-08-21 DIAGNOSIS — I4711 Inappropriate sinus tachycardia, so stated: Secondary | ICD-10-CM | POA: Diagnosis present

## 2021-08-21 DIAGNOSIS — Z87891 Personal history of nicotine dependence: Secondary | ICD-10-CM

## 2021-08-21 DIAGNOSIS — Z23 Encounter for immunization: Secondary | ICD-10-CM

## 2021-08-21 LAB — URINALYSIS, ROUTINE W REFLEX MICROSCOPIC
Bilirubin Urine: NEGATIVE
Glucose, UA: NEGATIVE mg/dL
Ketones, ur: NEGATIVE mg/dL
Leukocytes,Ua: NEGATIVE
Nitrite: NEGATIVE
Protein, ur: 30 mg/dL — AB
Specific Gravity, Urine: 1.025 (ref 1.005–1.030)
pH: 6 (ref 5.0–8.0)

## 2021-08-21 LAB — CBC
HCT: 42.4 % (ref 39.0–52.0)
Hemoglobin: 14 g/dL (ref 13.0–17.0)
MCH: 29.2 pg (ref 26.0–34.0)
MCHC: 33 g/dL (ref 30.0–36.0)
MCV: 88.5 fL (ref 80.0–100.0)
Platelets: 244 10*3/uL (ref 150–400)
RBC: 4.79 MIL/uL (ref 4.22–5.81)
RDW: 13 % (ref 11.5–15.5)
WBC: 7.5 10*3/uL (ref 4.0–10.5)
nRBC: 0 % (ref 0.0–0.2)

## 2021-08-21 LAB — LIPASE, BLOOD: Lipase: 38 U/L (ref 11–51)

## 2021-08-21 LAB — COMPREHENSIVE METABOLIC PANEL
ALT: 2504 U/L — ABNORMAL HIGH (ref 0–44)
AST: 3225 U/L — ABNORMAL HIGH (ref 15–41)
Albumin: 4.5 g/dL (ref 3.5–5.0)
Alkaline Phosphatase: 169 U/L — ABNORMAL HIGH (ref 38–126)
Anion gap: 12 (ref 5–15)
BUN: 22 mg/dL (ref 8–23)
CO2: 24 mmol/L (ref 22–32)
Calcium: 9.8 mg/dL (ref 8.9–10.3)
Chloride: 98 mmol/L (ref 98–111)
Creatinine, Ser: 1.36 mg/dL — ABNORMAL HIGH (ref 0.61–1.24)
GFR, Estimated: 55 mL/min — ABNORMAL LOW (ref >60)
Glucose, Bld: 177 mg/dL — ABNORMAL HIGH (ref 70–99)
Potassium: 4.4 mmol/L (ref 3.5–5.1)
Sodium: 134 mmol/L — ABNORMAL LOW (ref 135–145)
Total Bilirubin: 2.2 mg/dL — ABNORMAL HIGH (ref 0.3–1.2)
Total Protein: 7.3 g/dL (ref 6.5–8.1)

## 2021-08-21 LAB — URINALYSIS, MICROSCOPIC (REFLEX)
Bacteria, UA: NONE SEEN
RBC / HPF: NONE SEEN RBC/hpf (ref 0–5)
Squamous Epithelial / LPF: NONE SEEN (ref 0–5)
WBC, UA: NONE SEEN WBC/hpf (ref 0–5)

## 2021-08-21 LAB — ACETAMINOPHEN LEVEL: Acetaminophen (Tylenol), Serum: <10 ug/mL — ABNORMAL LOW (ref 10–30)

## 2021-08-21 LAB — AMMONIA: Ammonia: 32 umol/L (ref 9–35)

## 2021-08-21 LAB — SALICYLATE LEVEL: Salicylate Lvl: <7 mg/dL — ABNORMAL LOW (ref 7.0–30.0)

## 2021-08-21 LAB — LACTIC ACID, PLASMA: Lactic Acid, Venous: 3.1 mmol/L (ref 0.5–1.9)

## 2021-08-21 LAB — PROTIME-INR
INR: 1.1 (ref 0.8–1.2)
Prothrombin Time: 14.2 seconds (ref 11.4–15.2)

## 2021-08-21 MED ORDER — SODIUM CHLORIDE 0.9 % IV SOLN
2.0000 g | Freq: Once | INTRAVENOUS | Status: AC
  Administered 2021-08-22: 2 g via INTRAVENOUS
  Filled 2021-08-21: qty 2

## 2021-08-21 MED ORDER — METRONIDAZOLE 500 MG/100ML IV SOLN
500.0000 mg | Freq: Once | INTRAVENOUS | Status: AC
  Administered 2021-08-22: 500 mg via INTRAVENOUS
  Filled 2021-08-21: qty 100

## 2021-08-21 MED ORDER — LACTATED RINGERS IV SOLN: INTRAVENOUS | Status: DC

## 2021-08-21 MED ORDER — LACTATED RINGERS IV BOLUS (SEPSIS)
1000.0000 mL | Freq: Once | INTRAVENOUS | Status: AC
  Administered 2021-08-22: 1000 mL via INTRAVENOUS

## 2021-08-21 MED ORDER — IOHEXOL 300 MG/ML  SOLN
100.0000 mL | Freq: Once | INTRAMUSCULAR | Status: AC | PRN
  Administered 2021-08-22: 100 mL via INTRAVENOUS

## 2021-08-21 MED ORDER — SODIUM CHLORIDE 0.9 % IV BOLUS
1000.0000 mL | Freq: Once | INTRAVENOUS | Status: AC
  Administered 2021-08-21: 1000 mL via INTRAVENOUS

## 2021-08-21 MED ORDER — LACTATED RINGERS IV BOLUS (SEPSIS)
500.0000 mL | Freq: Once | INTRAVENOUS | Status: AC
  Administered 2021-08-22: 500 mL via INTRAVENOUS

## 2021-08-21 NOTE — ED Triage Notes (Signed)
Pt c/o lower abdominal pain that started today with vomiting

## 2021-08-21 NOTE — ED Notes (Signed)
Date and time results received: 08/21/21 2349 (use smartphrase ".now" to insert current time)  Test: lactic acid Critical Value: 3.1  Name of Provider Notified: Earna Coder, MD

## 2021-08-21 NOTE — ED Provider Notes (Signed)
[facility] EMERGENCY DEPARTMENT Provider Note   CSN: [id number] Arrival date & time: 08/21/21  1814     History Chief Complaint  Patient presents with   Abdominal Pain    Dustin Monroe is a 75 y.o. male.  Patient presents to the emergency department for evaluation of abdominal pain.  Patient reports that the pain was present earlier today for 4 or 5 hours.  Pain constant in the central upper abdomen.  Patient reports abdominal distention and feeling short of breath.  He did have nausea and vomited one time.  He reports that he vomited everything he had eaten during the day and then the pain was relieved.  He is now symptom-free.  He does feel, however, like his legs are weaker than usual.  The symptoms have occurred before.  His doctor did an ultrasound to look for gallstones but it was negative.  He has gastroenterology follow-up scheduled on January 19.        Past Medical History:  Diagnosis Date   Angina effort    DJD (degenerative joint disease)    DM (diabetes mellitus) (HCC)    GERD (gastroesophageal reflux disease)    HTN (hypertension)    Hyperlipemia     Patient Active Problem List   Diagnosis Date Noted   Acute hepatitis 08/22/2021   Primary osteoarthritis of both knees 09/27/2017   Polyarthritis 09/27/2017   Body mass index 36.0-36.9, adult 09/27/2017   Psoriasis 09/27/2017   Chronic pain syndrome 09/27/2017   Osteoarthritis of right knee 09/27/2017   Degenerative arthritis of left knee 09/27/2017   Essential familial hypercholesterolemia 09/27/2017   Essential hypertension, benign 09/27/2017   Diabetes mellitus without complication (HCC) 09/27/2017   High risk medication use 09/27/2017    Past Surgical History:  Procedure Laterality Date   KNEE ARTHROSCOPY Bilateral    PILONIDAL CYST EXCISION         Family History  Problem Relation Age of Onset   Diabetes Mother    Hypertension Mother    Prostate cancer Father    Diabetes Father     Hypertension Father    Hypertension Sister    Hypertension Brother    Hypertension Brother    Hypertension Brother    Hypertension Sister     Social History   Tobacco Use   Smoking status: Former    Years: 15.00    Types: Cigarettes   Smokeless tobacco: Never  Vaping Use   Vaping Use: Never used  Substance Use Topics   Alcohol use: No   Drug use: No    Home Medications Prior to Admission medications   Medication Sig Start Date End Date Taking? Authorizing Provider  amLODipine (NORVASC) 5 MG tablet Take 1 tablet (5 mg total) by mouth daily. 11/14/17 03/19/20  Laqueta Linden, MD  aspirin EC 81 MG tablet Take 81 mg by mouth daily.    [provider]  atorvastatin (LIPITOR) 20 MG tablet Take 20 mg by mouth daily at 6 PM.    [provider]  esomeprazole (NEXIUM) 20 MG packet Take 20 mg by mouth daily before breakfast.    [provider]  hydrochlorothiazide (HYDRODIURIL) 25 MG tablet Take 25 mg by mouth daily.    [provider]  HYDROcodone-acetaminophen (NORCO) 10-325 MG tablet Take 1 tablet by mouth every 8 (eight) hours as needed.    [provider]  hydrOXYzine (ATARAX/VISTARIL) 25 MG tablet Take 25 mg by mouth 3 (three) times daily as needed.  [provider]  losartan (COZAAR) 100 MG tablet Take 1 tablet by mouth daily. 11/09/17   [provider]  metFORMIN (GLUCOPHAGE) 1000 MG tablet Take 1,000 mg by mouth daily after supper.    [provider]  metoprolol tartrate (LOPRESSOR) 25 MG tablet Take 1 tablet by mouth twice daily 03/02/21   Netta Neat., NP  naproxen sodium (ALEVE) 220 MG tablet Take 220 mg by mouth at bedtime. Take 2 tabs at bedtime    [provider]    Allergies    Patient has no known allergies.  Review of Systems   Review of Systems  Constitutional:  Positive for fatigue.  Respiratory:  Positive for shortness of breath.   Gastrointestinal:  Positive for abdominal  distention, abdominal pain, nausea and vomiting. Negative for constipation and diarrhea.  All other systems reviewed and are negative.  Physical Exam Updated Vital Signs BP (!) 161/91   Pulse 91   Temp (!) 101.7 F (38.7 C) (Rectal)   Resp 16   Ht 5\' 5"  (1.651 m)   Wt 81.6 kg   SpO2 97%   BMI 29.95 kg/m   Physical Exam Vitals and nursing note reviewed.  Constitutional:      General: He is not in acute distress.    Appearance: Normal appearance. He is well-developed.  HENT:     Head: Normocephalic and atraumatic.     Right Ear: Hearing normal.     Left Ear: Hearing normal.     Nose: Nose normal.  Eyes:     Conjunctiva/sclera: Conjunctivae normal.     Pupils: Pupils are equal, round, and reactive to light.  Cardiovascular:     Rate and Rhythm: Regular rhythm. Tachycardia present.     Heart sounds: S1 normal and S2 normal. No murmur heard.   No friction rub. No gallop.  Pulmonary:     Effort: Pulmonary effort is normal. No respiratory distress.     Breath sounds: Normal breath sounds.  Chest:     Chest wall: No tenderness.  Abdominal:     General: Bowel sounds are normal.     Palpations: Abdomen is soft.     Tenderness: There is abdominal tenderness in the epigastric area. There is no guarding or rebound. Negative signs include Murphy's sign and McBurney's sign.     Hernia: No hernia is present.  Musculoskeletal:        General: Normal range of motion.     Cervical back: Normal range of motion and neck supple.  Skin:    General: Skin is warm and dry.     Findings: No rash.  Neurological:     Mental Status: He is alert and oriented to person, place, and time.     GCS: GCS eye subscore is 4. GCS verbal subscore is 5. GCS motor subscore is 6.     Cranial Nerves: No cranial nerve deficit.     Sensory: No sensory deficit.     Coordination: Coordination normal.  Psychiatric:        Speech: Speech normal.        Behavior: Behavior normal.        Thought Content:  Thought content normal.    ED Results / Procedures / Treatments   Labs (all labs ordered are listed, but only abnormal results are displayed) Labs Reviewed  COMPREHENSIVE METABOLIC PANEL - Abnormal; Notable for the following components:      Result Value   Sodium 134 (*)  Glucose, Bld 177 (*)    Creatinine, Ser 1.36 (*)    AST 3,225 (*)    ALT 2,504 (*)    Alkaline Phosphatase 169 (*)    Total Bilirubin 2.2 (*)    GFR, Estimated 55 (*)    All other components within normal limits  URINALYSIS, ROUTINE W REFLEX MICROSCOPIC - Abnormal; Notable for the following components:   APPearance HAZY (*)    Hgb urine dipstick TRACE (*)    Protein, ur 30 (*)    All other components within normal limits  LACTIC ACID, PLASMA - Abnormal; Notable for the following components:   Lactic Acid, Venous 3.1 (*)    All other components within normal limits  SALICYLATE LEVEL - Abnormal; Notable for the following components:   Salicylate Lvl <7.0 (*)    All other components within normal limits  ACETAMINOPHEN LEVEL - Abnormal; Notable for the following components:   Acetaminophen (Tylenol), Serum <10 (*)    All other components within normal limits  RESP PANEL BY RT-PCR (FLU A&B, COVID) ARPGX2  CULTURE, BLOOD (SINGLE)  LIPASE, BLOOD  CBC  URINALYSIS, MICROSCOPIC (REFLEX)  AMMONIA  PROTIME-INR  LACTIC ACID, PLASMA  HEPATITIS PANEL, ACUTE    EKG None  Radiology CT ABDOMEN PELVIS W CONTRAST  Result Date: 08/22/2021 CLINICAL DATA:  Abdominal pain. EXAM: CT ABDOMEN AND PELVIS WITH CONTRAST TECHNIQUE: Multidetector CT imaging of the abdomen and pelvis was performed using the standard protocol following bolus administration of intravenous contrast. CONTRAST:  OMNIPAQUE IOHEXOL 300 MG/ML  SOLN COMPARISON:  Abdominal ultrasound dated 12/18/2020. CT abdomen pelvis dated 12/08/2017. FINDINGS: Lower chest: The visualized lung bases are clear. There is coronary vascular calcification. No  intra-abdominal free air or free fluid. Hepatobiliary: Subcentimeter hepatic hypodense focus is too small to characterize. No intrahepatic biliary dilatation. No calcified gallstone or pericholecystic fluid. Pancreas: Unremarkable. No pancreatic ductal dilatation or surrounding inflammatory changes. Spleen: Normal in size without focal abnormality. Adrenals/Urinary Tract: The adrenal glands are unremarkable. The kidneys, visualized ureters, and urinary bladder appear unremarkable. Stomach/Bowel: There is no bowel obstruction or active inflammation. The appendix is normal. Vascular/Lymphatic: Moderate aortoiliac atherosclerotic disease. The IVC is unremarkable. No portal venous gas. There is no adenopathy. Reproductive: The prostate and seminal vesicles are grossly unremarkable. No pelvic mass. Other: None Musculoskeletal: Degenerative changes of the spine. No acute osseous pathology. IMPRESSION: 1. No acute intra-abdominal or pelvic pathology. 2. Aortic Atherosclerosis (ICD10-I70.0). Electronically Signed   By: Elgie Collard M.D.   On: 08/22/2021 01:19    Procedures Procedures   Medications Ordered in ED Medications  lactated ringers infusion ( Intravenous New Bag/Given 08/22/21 0028)  sodium chloride 0.9 % bolus 1,000 mL (0 mLs Intravenous Stopped 08/22/21 0420)  iohexol (OMNIPAQUE) 300 MG/ML solution 100 mL (100 mLs Intravenous Contrast Given 08/22/21 0052)  lactated ringers bolus 1,000 mL (0 mLs Intravenous Stopped 08/22/21 0420)    And  lactated ringers bolus 1,000 mL (0 mLs Intravenous Stopped 08/22/21 0420)    And  lactated ringers bolus 500 mL (0 mLs Intravenous Stopped 08/22/21 0421)  ceFEPIme (MAXIPIME) 2 g in sodium chloride 0.9 % 100 mL IVPB (0 g Intravenous Stopped 08/22/21 0421)  metroNIDAZOLE (FLAGYL) IVPB 500 mg (0 mg Intravenous Stopped 08/22/21 0421)    ED Course  I have reviewed the triage vital signs and the nursing notes.  Pertinent labs & imaging results that were  available during my care of the patient were reviewed by me and considered in my medical decision  making (see chart for details).    MDM Rules/Calculators/A&P                           Patient presents to the emergency department for evaluation of abdominal pain.  Patient reports severe upper abdominal pain that was present for 4 to 5 hours and then eased off.  He has been experiencing abdominal distention to the point where he feels somewhat short of breath.   Patient's pain is improved but he is still feeling generalized weakness.  Patient did spike a fever here in the emergency department to 101.7, covered for intra-abdominal sepsis.  He has not been hypotensive.  Patient has been persistently hypertensive, therefore shock liver not a consideration.  Patient administered broad-spectrum antibiotics, IV fluids.  Discussed with Dr. Marletta Lor, on-call for gastroenterology.  He feels that the patient would be reasonable to keep.  Any plan and he will consult.  Acute hepatitis panel pending.  Final Clinical Impression(s) / ED Diagnoses Final diagnoses:  Hepatitis    Rx / DC Orders ED Discharge Orders     None        Zyiere Rosemond, Canary Brim, MD 08/22/21 [phone]

## 2021-08-22 DIAGNOSIS — Z8249 Family history of ischemic heart disease and other diseases of the circulatory system: Secondary | ICD-10-CM | POA: Diagnosis not present

## 2021-08-22 DIAGNOSIS — R509 Fever, unspecified: Secondary | ICD-10-CM | POA: Diagnosis present

## 2021-08-22 DIAGNOSIS — K712 Toxic liver disease with acute hepatitis: Secondary | ICD-10-CM | POA: Diagnosis present

## 2021-08-22 DIAGNOSIS — R748 Abnormal levels of other serum enzymes: Secondary | ICD-10-CM | POA: Diagnosis present

## 2021-08-22 DIAGNOSIS — E872 Acidosis, unspecified: Secondary | ICD-10-CM | POA: Diagnosis present

## 2021-08-22 DIAGNOSIS — Z87891 Personal history of nicotine dependence: Secondary | ICD-10-CM | POA: Diagnosis not present

## 2021-08-22 DIAGNOSIS — N179 Acute kidney failure, unspecified: Secondary | ICD-10-CM | POA: Diagnosis present

## 2021-08-22 DIAGNOSIS — R1013 Epigastric pain: Secondary | ICD-10-CM

## 2021-08-22 DIAGNOSIS — E7801 Familial hypercholesterolemia: Secondary | ICD-10-CM | POA: Diagnosis present

## 2021-08-22 DIAGNOSIS — T43215A Adverse effect of selective serotonin and norepinephrine reuptake inhibitors, initial encounter: Secondary | ICD-10-CM | POA: Diagnosis present

## 2021-08-22 DIAGNOSIS — Z23 Encounter for immunization: Secondary | ICD-10-CM | POA: Diagnosis present

## 2021-08-22 DIAGNOSIS — Z20822 Contact with and (suspected) exposure to covid-19: Secondary | ICD-10-CM | POA: Diagnosis present

## 2021-08-22 DIAGNOSIS — R000 Tachycardia, unspecified: Secondary | ICD-10-CM | POA: Diagnosis present

## 2021-08-22 DIAGNOSIS — B179 Acute viral hepatitis, unspecified: Secondary | ICD-10-CM | POA: Diagnosis present

## 2021-08-22 DIAGNOSIS — Z79899 Other long term (current) drug therapy: Secondary | ICD-10-CM | POA: Diagnosis not present

## 2021-08-22 DIAGNOSIS — M170 Bilateral primary osteoarthritis of knee: Secondary | ICD-10-CM | POA: Diagnosis present

## 2021-08-22 DIAGNOSIS — G894 Chronic pain syndrome: Secondary | ICD-10-CM | POA: Diagnosis present

## 2021-08-22 DIAGNOSIS — K219 Gastro-esophageal reflux disease without esophagitis: Secondary | ICD-10-CM | POA: Diagnosis present

## 2021-08-22 DIAGNOSIS — A419 Sepsis, unspecified organism: Secondary | ICD-10-CM | POA: Diagnosis present

## 2021-08-22 DIAGNOSIS — Z7982 Long term (current) use of aspirin: Secondary | ICD-10-CM | POA: Diagnosis not present

## 2021-08-22 DIAGNOSIS — Z833 Family history of diabetes mellitus: Secondary | ICD-10-CM | POA: Diagnosis not present

## 2021-08-22 DIAGNOSIS — Y929 Unspecified place or not applicable: Secondary | ICD-10-CM | POA: Diagnosis not present

## 2021-08-22 DIAGNOSIS — I10 Essential (primary) hypertension: Secondary | ICD-10-CM | POA: Diagnosis present

## 2021-08-22 DIAGNOSIS — E119 Type 2 diabetes mellitus without complications: Secondary | ICD-10-CM

## 2021-08-22 DIAGNOSIS — R109 Unspecified abdominal pain: Secondary | ICD-10-CM | POA: Diagnosis present

## 2021-08-22 LAB — CBC WITH DIFFERENTIAL/PLATELET
Abs Immature Granulocytes: 0.03 10*3/uL (ref 0.00–0.07)
Basophils Absolute: 0 10*3/uL (ref 0.0–0.1)
Basophils Relative: 0 %
Eosinophils Absolute: 0.2 10*3/uL (ref 0.0–0.5)
Eosinophils Relative: 2 %
HCT: 37.2 % — ABNORMAL LOW (ref 39.0–52.0)
Hemoglobin: 12.6 g/dL — ABNORMAL LOW (ref 13.0–17.0)
Immature Granulocytes: 0 %
Lymphocytes Relative: 8 %
Lymphs Abs: 0.7 10*3/uL (ref 0.7–4.0)
MCH: 29.2 pg (ref 26.0–34.0)
MCHC: 33.9 g/dL (ref 30.0–36.0)
MCV: 86.1 fL (ref 80.0–100.0)
Monocytes Absolute: 1 10*3/uL (ref 0.1–1.0)
Monocytes Relative: 12 %
Neutro Abs: 6.5 10*3/uL (ref 1.7–7.7)
Neutrophils Relative %: 78 %
Platelets: 209 10*3/uL (ref 150–400)
RBC: 4.32 MIL/uL (ref 4.22–5.81)
RDW: 13.1 % (ref 11.5–15.5)
WBC: 8.4 10*3/uL (ref 4.0–10.5)
nRBC: 0 % (ref 0.0–0.2)

## 2021-08-22 LAB — LIPID PANEL
Cholesterol: 102 mg/dL (ref 0–200)
HDL: 25 mg/dL — ABNORMAL LOW (ref >40)
LDL Cholesterol: 57 mg/dL (ref 0–99)
Total CHOL/HDL Ratio: 4.1 RATIO
Triglycerides: 101 mg/dL (ref <150)
VLDL: 20 mg/dL (ref 0–40)

## 2021-08-22 LAB — HEMOGLOBIN A1C
Hgb A1c MFr Bld: 5.4 % (ref 4.8–5.6)
Mean Plasma Glucose: 108.28 mg/dL

## 2021-08-22 LAB — CBG MONITORING, ED: Glucose-Capillary: 101 mg/dL — ABNORMAL HIGH (ref 70–99)

## 2021-08-22 LAB — RAPID URINE DRUG SCREEN, HOSP PERFORMED
Amphetamines: NOT DETECTED
Barbiturates: NOT DETECTED
Benzodiazepines: NOT DETECTED
Cocaine: NOT DETECTED
Opiates: POSITIVE — AB
Tetrahydrocannabinol: NOT DETECTED

## 2021-08-22 LAB — CREATININE, SERUM
Creatinine, Ser: 0.82 mg/dL (ref 0.61–1.24)
GFR, Estimated: >60 mL/min (ref >60)

## 2021-08-22 LAB — LACTIC ACID, PLASMA: Lactic Acid, Venous: 1.6 mmol/L (ref 0.5–1.9)

## 2021-08-22 LAB — VITAMIN D 25 HYDROXY (VIT D DEFICIENCY, FRACTURES): Vit D, 25-Hydroxy: 85 ng/mL (ref 30–100)

## 2021-08-22 LAB — RESP PANEL BY RT-PCR (FLU A&B, COVID) ARPGX2
Influenza A by PCR: NEGATIVE
Influenza B by PCR: NEGATIVE
SARS Coronavirus 2 by RT PCR: NEGATIVE

## 2021-08-22 MED ORDER — HEPARIN SODIUM (PORCINE) 5000 UNIT/ML IJ SOLN
5000.0000 [IU] | Freq: Three times a day (TID) | INTRAMUSCULAR | Status: DC
  Administered 2021-08-22 – 2021-08-24 (×6): 5000 [IU] via SUBCUTANEOUS
  Filled 2021-08-22 (×6): qty 1

## 2021-08-22 MED ORDER — BISACODYL 5 MG PO TBEC: 5.0000 mg | DELAYED_RELEASE_TABLET | Freq: Every day | ORAL | Status: DC | PRN

## 2021-08-22 MED ORDER — SODIUM CHLORIDE 0.9 % IV SOLN
2.0000 g | INTRAVENOUS | Status: DC
  Administered 2021-08-22 – 2021-08-23 (×2): 2 g via INTRAVENOUS
  Filled 2021-08-22 (×2): qty 20

## 2021-08-22 MED ORDER — ONDANSETRON HCL 4 MG/2ML IJ SOLN: 4.0000 mg | Freq: Four times a day (QID) | INTRAMUSCULAR | Status: DC | PRN

## 2021-08-22 MED ORDER — METOPROLOL TARTRATE 25 MG PO TABS
25.0000 mg | ORAL_TABLET | Freq: Two times a day (BID) | ORAL | Status: DC
  Administered 2021-08-22 – 2021-08-24 (×4): 25 mg via ORAL
  Filled 2021-08-22 (×4): qty 1

## 2021-08-22 MED ORDER — ONDANSETRON HCL 4 MG PO TABS: 4.0000 mg | ORAL_TABLET | Freq: Four times a day (QID) | ORAL | Status: DC | PRN

## 2021-08-22 MED ORDER — METRONIDAZOLE 500 MG/100ML IV SOLN
500.0000 mg | Freq: Two times a day (BID) | INTRAVENOUS | Status: DC
  Administered 2021-08-22 – 2021-08-24 (×4): 500 mg via INTRAVENOUS
  Filled 2021-08-22 (×4): qty 100

## 2021-08-22 MED ORDER — INSULIN ASPART 100 UNIT/ML IJ SOLN: 0.0000 [IU] | Freq: Three times a day (TID) | INTRAMUSCULAR | Status: DC

## 2021-08-22 MED ORDER — OXYCODONE HCL 5 MG PO TABS: 2.5000 mg | ORAL_TABLET | ORAL | Status: DC | PRN

## 2021-08-22 MED ORDER — SODIUM CHLORIDE 0.9 % IV SOLN: INTRAVENOUS | Status: DC

## 2021-08-22 MED ORDER — HYDRALAZINE HCL 20 MG/ML IJ SOLN: 5.0000 mg | INTRAMUSCULAR | Status: DC | PRN

## 2021-08-22 NOTE — Progress Notes (Signed)
Attempted to call for report x2 with no answer.

## 2021-08-22 NOTE — Sepsis Progress Note (Signed)
Notified provider of need to order repeat lactic acid. ° °

## 2021-08-22 NOTE — Sepsis Progress Note (Addendum)
Notified bedside nurse of need to draw blood cultures.  Verified with bedside RN via secure chat that antibiotics were given before cultures drawn.  Per RN, antibiotics ordered prior to cultures being ordered.

## 2021-08-22 NOTE — Evaluation (Signed)
Physical Therapy Evaluation Patient Details Name: Dustin Monroe MRN: [id number] DOB: Oct 07, 1945 Today's Date: 08/22/2021  History of Present Illness  Dustin Monroe is a 75 y.o. male with medical history significant for inappropriate sinus tachycardia, accelerated hypertension, managed by cardiology, type 2 diabetes mellitus, hyperlipidemia, GERD, DJD and osteoarthritis presented to the emergency department yesterday with complaints of abdominal pain that has been present for about 5 hours prior to arrival.  He reported generalized abdominal pain.  He also had fever nausea and emesis.  He said that this was associated with abdominal distention.  He was having some shortness of breath with these symptoms.  He only vomited once but is nauseous been severe.  He reported that after vomiting all of the food that he ate yesterday he reports that he felt better he has been more weak in the last 24 hours.  He reportedly had a negative abdominal ultrasound performed by his PCP and was scheduled to see a gastroenterologist in January.  He says that his symptoms had resolved by the time he arrived in the ED   Clinical Impression  Patient functioning at baseline for functional mobility and gait demonstrating good return for ambulation in room and hallways without loss of balance.  Plan:  Patient discharged from physical therapy to care of nursing for ambulation daily as tolerated for length of stay.         Recommendations for follow up therapy are one component of a multi-disciplinary discharge planning process, led by the attending physician.  Recommendations may be updated based on patient status, additional functional criteria and insurance authorization.  Follow Up Recommendations No PT follow up    Assistance Recommended at Discharge None  Functional Status Assessment Patient has not had a recent decline in their functional status  Equipment Recommendations  None recommended by PT     Recommendations for Other Services       Precautions / Restrictions Precautions Precautions: None Restrictions Weight Bearing Restrictions: No      Mobility  Bed Mobility Overal bed mobility: Independent                  Transfers Overall transfer level: Independent                      Ambulation/Gait Ambulation/Gait assistance: Modified independent (Device/Increase time) Gait Distance (Feet): 200 Feet Assistive device: None;IV Pole Gait Pattern/deviations: WFL(Within Functional Limits) Gait velocity: near normal     General Gait Details: grossly WFL with good return for ambulation in room and hallway without AD and when pushing IV pole  Stairs            Wheelchair Mobility    Modified Rankin (Stroke Patients Only)       Balance Overall balance assessment: No apparent balance deficits (not formally assessed)                                           Pertinent Vitals/Pain Pain Assessment: No/denies pain    Home Living Family/patient expects to be discharged to:: Private residence Living Arrangements: Other relatives Available Help at Discharge: Family;Available 24 hours/day Type of Home: House Home Access: Ramped entrance       Home Layout: One level Home Equipment: Cane - single point      Prior Function Prior Level of Function : Independent/Modified Independent  Mobility Comments: Tourist information centre manager, drives ADLs Comments: Independent     Hand Dominance        Extremity/Trunk Assessment   Upper Extremity Assessment Upper Extremity Assessment: Overall WFL for tasks assessed    Lower Extremity Assessment Lower Extremity Assessment: Overall WFL for tasks assessed    Cervical / Trunk Assessment Cervical / Trunk Assessment: Normal  Communication   Communication: No difficulties  Cognition Arousal/Alertness: Awake/alert Behavior During Therapy: WFL for tasks  assessed/performed Overall Cognitive Status: No family/caregiver present to determine baseline cognitive functioning                                          General Comments      Exercises     Assessment/Plan    PT Assessment Patient does not need any further PT services  PT Problem List         PT Treatment Interventions      PT Goals (Current goals can be found in the Care Plan section)  Acute Rehab PT Goals Patient Stated Goal: return home PT Goal Formulation: With patient Time For Goal Achievement: 08/22/21 Potential to Achieve Goals: Good    Frequency     Barriers to discharge        Co-evaluation               AM-PAC PT "6 Clicks" Mobility  Outcome Measure Help needed turning from your back to your side while in a flat bed without using bedrails?: None Help needed moving from lying on your back to sitting on the side of a flat bed without using bedrails?: None Help needed moving to and from a bed to a chair (including a wheelchair)?: None Help needed standing up from a chair using your arms (e.g., wheelchair or bedside chair)?: None Help needed to walk in hospital room?: None Help needed climbing 3-5 steps with a railing? : None 6 Click Score: 24    End of Session   Activity Tolerance: Patient tolerated treatment well Patient left: in bed;with call bell/phone within reach Nurse Communication: Mobility status PT Visit Diagnosis: Unsteadiness on feet (R26.81);Other abnormalities of gait and mobility (R26.89);Muscle weakness (generalized) (M62.81)    Time: 1152-1206 PT Time Calculation (min) (ACUTE ONLY): 14 min   Charges:   PT Evaluation $PT Eval Low Complexity: 1 Low PT Treatments $Therapeutic Activity: 8-22 mins        12:51 PM, 08/22/21 Ocie Bob, MPT Physical Therapist with [facility] [phone] office [phone] mobile phone

## 2021-08-22 NOTE — ED Notes (Signed)
Unable to continue with pts fluids d/t being in CT for 45 min plus

## 2021-08-22 NOTE — Consult Note (Signed)
Consulting  Provider: Dr. Wynetta Emery Primary Care Physician:  Wannetta Sender, FNP Primary Gastroenterologist: Previously unassigned, Dr Abbey Chatters  Reason for Consultation: Abnormal LFTs  HPI:  Dustin Monroe is a 75 y.o. male with a past medical history of sinus tachycardia, hypertension, type 2 diabetes, dyslipidemia, GERD, osteoarthritis, who presented to [facility], ER yesterday evening with chief complaint of generalized abdominal pain.  Notes acute onset before arrival, generalized.  Does not radiate.  Moderate to severe.  Also with associated abdominal distention.  Also notes 1 episode of nausea with vomiting, nonbloody, no coffee-ground.  Work-up in the ER showed patient febrile with temp 101.7.  He had significantly abnormal LFTs with AST 3225, ALT 2504, T bili 2.2, alk phos 169, INR 1.1, normal albumin, normal platelets.  CT abdomen pelvis with contrast relatively unremarkable besides a few chronic findings.  UDS positive for opiates though patient is chronically on Norco 7.5/325 3 times daily.  Still with elevated lactic acid which improved with fluid resuscitation.  Sepsis protocol initiated, patient placed on broad-spectrum antibiotics.  Source unclear at this time  Patient denies any history of liver disease.  No illicit drug use.  Denies any alcohol use.  No family history of liver disease.  No new medications.  No recent antibiotics. No herbal supplements.  No exposure to viral hepatitis that he is aware of.  Does note suffering a fall at home a few days ago after he tripped on his shoes.  Denies any loss of consciousness.  States he was on the floor for approximately 3 to 4 minutes.    Past Medical History:  Diagnosis Date   Angina effort    DJD (degenerative joint disease)    DM (diabetes mellitus) ([place])    GERD (gastroesophageal reflux disease)    HTN (hypertension)    Hyperlipemia     Past Surgical History:  Procedure Laterality Date   KNEE ARTHROSCOPY  Bilateral    PILONIDAL CYST EXCISION      Prior to Admission medications   Medication Sig Start Date End Date Taking? Authorizing Provider  amLODipine (NORVASC) 5 MG tablet Take 1 tablet (5 mg total) by mouth daily. 11/14/17 08/22/21 Yes Herminio Commons, MD  aspirin EC 81 MG tablet Take 81 mg by mouth daily.   Yes [provider]  atorvastatin (LIPITOR) 20 MG tablet Take 20 mg by mouth daily at 6 PM.   Yes [provider]  esomeprazole (NEXIUM) 20 MG packet Take 20 mg by mouth daily before breakfast.   Yes [provider]  hydrochlorothiazide (HYDRODIURIL) 25 MG tablet Take 25 mg by mouth daily.   Yes [provider]  HYDROcodone-acetaminophen (NORCO) 7.5-325 MG tablet Take 1 tablet by mouth 3 (three) times daily. 08/14/21  Yes [provider]  hydrOXYzine (ATARAX/VISTARIL) 25 MG tablet Take 25 mg by mouth daily.   Yes [provider]  losartan (COZAAR) 100 MG tablet Take 1 tablet by mouth daily. 11/09/17  Yes [provider]  metFORMIN (GLUCOPHAGE) 1000 MG tablet Take 1,000 mg by mouth daily after supper.   Yes [provider]  metoprolol tartrate (LOPRESSOR) 25 MG tablet Take 1 tablet by mouth twice daily Patient taking differently: Pt takes once daily 03/02/21  Yes Verta Ellen., NP  naproxen sodium (ALEVE) 220 MG tablet Take 220 mg by mouth at bedtime. Take 2 tabs at bedtime   Yes [provider]  traZODone (DESYREL) 150 MG tablet Take 225 mg by mouth at  bedtime. 05/11/21  Yes [provider]  HYDROcodone-acetaminophen (NORCO) 10-325 MG tablet Take 1 tablet by mouth every 8 (eight) hours as needed. Patient not taking: Reported on 08/22/2021    [provider]    Current Facility-Administered Medications  Medication Dose Route Frequency Provider Last Rate Last Admin   0.9 %  sodium chloride infusion   Intravenous Continuous Wynetta Emery, Clanford L, MD 100 mL/hr at 08/22/21 0825 New Bag at  08/22/21 0825   bisacodyl (DULCOLAX) EC tablet 5 mg  5 mg Oral Daily PRN Johnson, Clanford L, MD       cefTRIAXone (ROCEPHIN) 2 g in sodium chloride 0.9 % 100 mL IVPB  2 g Intravenous Q24H Johnson, Clanford L, MD 200 mL/hr at 08/22/21 1221 2 g at 08/22/21 1221   heparin injection 5,000 Units  5,000 Units Subcutaneous Q8H Johnson, Clanford L, MD       hydrALAZINE (APRESOLINE) injection 5 mg  5 mg Intravenous Q4H PRN Johnson, Clanford L, MD       insulin aspart (novoLOG) injection 0-15 Units  0-15 Units Subcutaneous TID WC Johnson, Clanford L, MD       metoprolol tartrate (LOPRESSOR) tablet 25 mg  25 mg Oral BID Johnson, Clanford L, MD       metroNIDAZOLE (FLAGYL) IVPB 500 mg  500 mg Intravenous Q12H Johnson, Clanford L, MD       ondansetron (ZOFRAN) tablet 4 mg  4 mg Oral Q6H PRN Johnson, Clanford L, MD       Or   ondansetron (ZOFRAN) injection 4 mg  4 mg Intravenous Q6H PRN Johnson, Clanford L, MD       oxyCODONE (Oxy IR/ROXICODONE) immediate release tablet 2.5-5 mg  2.5-5 mg Oral Q4H PRN Wynetta Emery, Clanford L, MD        Allergies as of 08/21/2021   (No Known Allergies)    Family History  Problem Relation Age of Onset   Diabetes Mother    Hypertension Mother    Prostate cancer Father    Diabetes Father    Hypertension Father    Hypertension Sister    Hypertension Brother    Hypertension Brother    Hypertension Brother    Hypertension Sister     Social History   Socioeconomic History   Marital status: Single    Spouse name: Not on file   Number of children: Not on file   Years of education: Not on file   Highest education level: Not on file  Occupational History   Not on file  Tobacco Use   Smoking status: Former    Years: 15.00    Types: Cigarettes   Smokeless tobacco: Never  Vaping Use   Vaping Use: Never used  Substance and Sexual Activity   Alcohol use: No   Drug use: No   Sexual activity: Not on file  Other Topics Concern   Not on file  Social History  Narrative   Not on file   Social Determinants of Health   Financial Resource Strain: Not on file  Food Insecurity: Not on file  Transportation Needs: Not on file  Physical Activity: Not on file  Stress: Not on file  Social Connections: Not on file  Intimate Partner Violence: Not on file    Review of Systems: General: Negative for anorexia, weight loss, fever, chills, fatigue, weakness. Eyes: Negative for vision changes.  ENT: Negative for hoarseness, difficulty swallowing , nasal congestion. CV: Negative for chest pain, angina, palpitations, dyspnea on exertion, peripheral edema.  Respiratory: Negative for dyspnea at rest, dyspnea on exertion, cough, sputum, wheezing.  GI: See history of present illness. GU:  Negative for dysuria, hematuria, urinary incontinence, urinary frequency, nocturnal urination.  MS: Negative for joint pain, low back pain.  Derm: Negative for rash or itching.  Neuro: Negative for weakness, abnormal sensation, seizure, frequent headaches, memory loss, confusion.  Psych: Negative for anxiety, depression Endo: Negative for unusual weight change.  Heme: Negative for bruising or bleeding. Allergy: Negative for rash or hives.  Physical Exam: Vital signs in last 24 hours: Temp:  [98 F (36.7 C)-101.7 F (38.7 C)] 98 F (36.7 C) (12/10 1128) Pulse Rate:  [86-134] 87 (12/10 1128) Resp:  [14-23] 16 (12/10 1128) BP: (126-161)/(66-91) 156/85 (12/10 1128) SpO2:  [96 %-99 %] 99 % (12/10 1128) Weight:  [81.6 kg] 81.6 kg (12/09 2033) Last BM Date: 08/21/21 General:   Alert,  Well-developed, well-nourished, pleasant and cooperative in NAD Head:  Normocephalic and atraumatic. Eyes:  Sclera clear, no icterus.   Conjunctiva pink. Ears:  Normal auditory acuity. Nose:  No deformity, discharge,  or lesions. Mouth:  No deformity or lesions, dentition normal. Neck:  Supple; no masses or thyromegaly. Lungs:  Clear throughout to auscultation.   No wheezes, crackles, or  rhonchi. No acute distress. Heart:  Regular rate and rhythm; no murmurs, clicks, rubs,  or gallops. Abdomen:  Soft, nontender and nondistended. No masses, hepatosplenomegaly or hernias noted. Normal bowel sounds, without guarding, and without rebound.   Msk:  Symmetrical without gross deformities. Normal posture. Pulses:  Normal pulses noted. Extremities:  Without clubbing or edema. Neurologic:  Alert and  oriented x4;  grossly normal neurologically. Skin:  Intact without significant lesions or rashes. Cervical Nodes:  No significant cervical adenopathy. Psych:  Alert and cooperative. Normal mood and affect.  Intake/Output from previous day: 12/09 0701 - 12/10 0700 In: 3700 [IV Piggyback:3700] Out: -  Intake/Output this shift: Total I/O In: 1000 [I.V.:1000] Out: 2100 [Urine:2100]  Lab Results: Recent Labs    08/21/21 2059 08/22/21 0743  WBC 7.5 8.4  HGB 14.0 12.6*  HCT 42.4 37.2*  PLT 244 209   BMET Recent Labs    08/21/21 2059 08/22/21 0743  NA 134*  --   K 4.4  --   CL 98  --   CO2 24  --   GLUCOSE 177*  --   BUN 22  --   CREATININE 1.36* 0.82  CALCIUM 9.8  --    LFT Recent Labs    08/21/21 2059  PROT 7.3  ALBUMIN 4.5  AST 3,225*  ALT 2,504*  ALKPHOS 169*  BILITOT 2.2*   PT/INR Recent Labs    08/21/21 2322  LABPROT 14.2  INR 1.1   Hepatitis Panel No results for input(s): HEPBSAG, HCVAB, HEPAIGM, HEPBIGM in the last 72 hours. C-Diff No results for input(s): CDIFFTOX in the last 72 hours.  Studies/Results: CT ABDOMEN PELVIS W CONTRAST  Result Date: 08/22/2021 CLINICAL DATA:  Abdominal pain. EXAM: CT ABDOMEN AND PELVIS WITH CONTRAST TECHNIQUE: Multidetector CT imaging of the abdomen and pelvis was performed using the standard protocol following bolus administration of intravenous contrast. CONTRAST:  113m OMNIPAQUE IOHEXOL 300 MG/ML  SOLN COMPARISON:  Abdominal ultrasound dated 12/18/2020. CT abdomen pelvis dated 12/08/2017. FINDINGS: Lower  chest: The visualized lung bases are clear. There is coronary vascular calcification. No intra-abdominal free air or free fluid. Hepatobiliary: Subcentimeter hepatic hypodense focus is too small to characterize. No intrahepatic biliary dilatation. No calcified gallstone or pericholecystic  fluid. Pancreas: Unremarkable. No pancreatic ductal dilatation or surrounding inflammatory changes. Spleen: Normal in size without focal abnormality. Adrenals/Urinary Tract: The adrenal glands are unremarkable. The kidneys, visualized ureters, and urinary bladder appear unremarkable. Stomach/Bowel: There is no bowel obstruction or active inflammation. The appendix is normal. Vascular/Lymphatic: Moderate aortoiliac atherosclerotic disease. The IVC is unremarkable. No portal venous gas. There is no adenopathy. Reproductive: The prostate and seminal vesicles are grossly unremarkable. No pelvic mass. Other: None Musculoskeletal: Degenerative changes of the spine. No acute osseous pathology. IMPRESSION: 1. No acute intra-abdominal or pelvic pathology. 2. Aortic Atherosclerosis (ICD10-I70.0). Electronically Signed   By: Anner Crete M.D.   On: 08/22/2021 01:19    Impression: *Acute hepatitis *Sepsis-source unclear  Plan: Etiology of patient's acute hepatitis unclear.  Given his significantly elevated aminotransferases, differential includes drug-induced hepatitis, shock liver, viral hepatitis, sepsis.  Fortunately his synthetic function remains intact with normal INR.  Would continue supportive care at this time.  Viral hepatitis panel pending.  Patient does chronically take Norco 3 times daily though he states he does not take any additional Tylenol.  Acetaminophen level reassuring.  Continue to monitor daily CMP and coags.  Can consider IV NAC if his INR worsens.  Would hold off for now.  Sepsis management per hospitalist.  GI to continue to follow.  Elon Alas. Abbey Chatters, D.O. Gastroenterology and  Hepatology [facility]    LOS: 0 days     08/22/2021, 1:00 PM

## 2021-08-22 NOTE — Sepsis Progress Note (Signed)
Monitoring for the code sepsis protocol. °

## 2021-08-22 NOTE — H&P (Addendum)
History and Physical  [facility] Dustin Monroe:[id number] DOB: 1946/07/22 DOA: 08/21/2021  PCP: Dustin Coop, FNP  Patient coming from: Home  Level of care: Telemetry  I have personally briefly reviewed patient's old medical records in [facility]  Chief Complaint: abdominal pain   HPI: Dustin Monroe is a 75 y.o. male with medical history significant for inappropriate sinus tachycardia, accelerated hypertension, managed by cardiology, type 2 diabetes mellitus, hyperlipidemia, GERD, DJD and osteoarthritis presented to the emergency department yesterday with complaints of abdominal pain that has been present for about 5 hours prior to arrival.  He reported generalized abdominal pain.  He also had fever nausea and emesis.  He said that this was associated with abdominal distention.  He was having some shortness of breath with these symptoms.  He only vomited once but his nausea has been severe.  He reported that after vomiting all of the food that he ate yesterday he reports that he felt better he has been more weak in the last 24 hours.  He reportedly had a negative abdominal ultrasound and was scheduled to see a gastroenterologist in January.  He says that his symptoms had resolved by the time he arrived in the ED.  ED Course: Patient spiked a fever of 101.7, sodium was 134, glucose 177, creatinine 1.36, AST 3225, ALT 2504, total bilirubin 2.2, lactic acid 3.1, WBC 7.5, hemoglobin 14.0, platelet count 244.,  Ammonia 32.,  INR 1.1, PT 14.2, acetaminophen level less than 10, salicylate level less than 7.0, urinalysis unremarkable, patient started on sepsis protocol with IV antibiotics broad-spectrum coverage started.  CT of the abdomen and pelvis with contrast negative for any acute findings.  GI consulted and stated that okay to admit to [facility] and will consult.  Review of Systems: Review of Systems  Constitutional:  Positive for chills, fever and  malaise/fatigue.  HENT: Negative.    Respiratory: Negative.    Cardiovascular:  Positive for palpitations. Negative for chest pain, leg swelling and PND.  Gastrointestinal:  Positive for abdominal pain, heartburn, nausea and vomiting.  Genitourinary: Negative.   Musculoskeletal: Negative.   Skin: Negative.   Neurological: Negative.   Endo/Heme/Allergies: Negative.   Psychiatric/Behavioral: Negative.    All other systems reviewed and are negative.   Past Medical History:  Diagnosis Date   Angina effort    DJD (degenerative joint disease)    DM (diabetes mellitus) ([facility])    GERD (gastroesophageal reflux disease)    HTN (hypertension)    Hyperlipemia     Past Surgical History:  Procedure Laterality Date   KNEE ARTHROSCOPY Bilateral    PILONIDAL CYST EXCISION       reports that he has quit smoking. He has never used smokeless tobacco. He reports that he does not drink alcohol and does not use drugs.  No Known Allergies  Family History  Problem Relation Age of Onset   Diabetes Mother    Hypertension Mother    Prostate cancer Father    Diabetes Father    Hypertension Father    Hypertension Sister    Hypertension Brother    Hypertension Brother    Hypertension Brother    Hypertension Sister     Prior to Admission medications   Medication Sig Start Date End Date Taking? Authorizing Provider  amLODipine (NORVASC) 5 MG tablet Take 1 tablet (5 mg total) by mouth daily. 11/14/17 03/19/20  Laqueta Linden, MD  aspirin EC 81 MG tablet  Take 81 mg by mouth daily.    [provider]  atorvastatin (LIPITOR) 20 MG tablet Take 20 mg by mouth daily at 6 PM.    [provider]  esomeprazole (NEXIUM) 20 MG packet Take 20 mg by mouth daily before breakfast.    [provider]  hydrochlorothiazide (HYDRODIURIL) 25 MG tablet Take 25 mg by mouth daily.    [provider]  HYDROcodone-acetaminophen (NORCO) 10-325 MG tablet Take 1 tablet by mouth every 8  (eight) hours as needed.    [provider]  hydrOXYzine (ATARAX/VISTARIL) 25 MG tablet Take 25 mg by mouth 3 (three) times daily as needed.    [provider]  losartan (COZAAR) 100 MG tablet Take 1 tablet by mouth daily. 11/09/17   [provider]  metFORMIN (GLUCOPHAGE) 1000 MG tablet Take 1,000 mg by mouth daily after supper.    [provider]  metoprolol tartrate (LOPRESSOR) 25 MG tablet Take 1 tablet by mouth twice daily 03/02/21   Netta Neat., NP  naproxen sodium (ALEVE) 220 MG tablet Take 220 mg by mouth at bedtime. Take 2 tabs at bedtime    [provider]    Physical Exam: Vitals:   08/22/21 0330 08/22/21 0500 08/22/21 0530 08/22/21 0700  BP: (!) 159/82 (!) 150/78 (!) 161/91 (!) 155/77  Pulse: (!) 134 96 91 88  Resp: (!) 22 (!) 23 16 19   Temp:      TempSrc:      SpO2: 98% 97% 97% 97%  Weight:      Height:        Constitutional: NAD, calm, comfortable Eyes: PERRL, lids and conjunctivae normal ENMT: Mucous membranes are dry. Posterior pharynx clear of any exudate or lesions.Normal dentition.  Neck: normal, supple, no masses, no thyromegaly Respiratory: clear to auscultation bilaterally, no wheezing, no crackles. Normal respiratory effort. No accessory muscle use.  Cardiovascular: normal s1, s2 sounds, tachycardic rate, no murmurs / rubs / gallops. No extremity edema. 2+ pedal pulses. No carotid bruits.  Abdomen: Epigastric abdominal pain, no masses palpated. No hepatosplenomegaly. Bowel sounds positive.  Musculoskeletal: no clubbing / cyanosis. No joint deformity upper and lower extremities. Good ROM, no contractures. Normal muscle tone.  Skin: no rashes, lesions, ulcers. No induration Neurologic: CN 2-12 grossly intact. Sensation intact, DTR normal. Strength 5/5 in all 4.  Psychiatric: Normal judgment and insight. Alert and oriented x 3. Normal mood.   Labs on Admission: I have personally reviewed following labs and imaging  studies  CBC: Recent Labs  Lab 08/21/21 2059  WBC 7.5  HGB 14.0  HCT 42.4  MCV 88.5  PLT 244   Basic Metabolic Panel: Recent Labs  Lab 08/21/21 2059  NA 134*  K 4.4  CL 98  CO2 24  GLUCOSE 177*  BUN 22  CREATININE 1.36*  CALCIUM 9.8   GFR: Estimated Creatinine Clearance: 46.8 mL/min (A) (by C-G formula based on SCr of 1.36 mg/dL (H)). Liver Function Tests: Recent Labs  Lab 08/21/21 2059  AST 3,225*  ALT 2,504*  ALKPHOS 169*  BILITOT 2.2*  PROT 7.3  ALBUMIN 4.5   Recent Labs  Lab 08/21/21 2059  LIPASE 38   Recent Labs  Lab 08/21/21 2322  AMMONIA 32   Coagulation Profile: Recent Labs  Lab 08/21/21 2322  INR 1.1   Cardiac Enzymes: No results for input(s): CKTOTAL, CKMB, CKMBINDEX, TROPONINI in the last 168 hours. BNP (last 3 results) No results for input(s): PROBNP in the last 8760  hours. HbA1C: No results for input(s): HGBA1C in the last 72 hours. CBG: No results for input(s): GLUCAP in the last 168 hours. Lipid Profile: No results for input(s): CHOL, HDL, LDLCALC, TRIG, CHOLHDL, LDLDIRECT in the last 72 hours. Thyroid Function Tests: No results for input(s): TSH, T4TOTAL, FREET4, T3FREE, THYROIDAB in the last 72 hours. Anemia Panel: No results for input(s): VITAMINB12, FOLATE, FERRITIN, TIBC, IRON, RETICCTPCT in the last 72 hours. Urine analysis:    Component Value Date/Time   COLORURINE YELLOW 08/21/2021 2053   APPEARANCEUR HAZY (A) 08/21/2021 2053   LABSPEC 1.025 08/21/2021 2053   PHURINE 6.0 08/21/2021 2053   GLUCOSEU NEGATIVE 08/21/2021 2053   HGBUR TRACE (A) 08/21/2021 2053   BILIRUBINUR NEGATIVE 08/21/2021 2053   KETONESUR NEGATIVE 08/21/2021 2053   PROTEINUR 30 (A) 08/21/2021 2053   NITRITE NEGATIVE 08/21/2021 2053   LEUKOCYTESUR NEGATIVE 08/21/2021 2053    Radiological Exams on Admission: CT ABDOMEN PELVIS W CONTRAST  Result Date: 08/22/2021 CLINICAL DATA:  Abdominal pain. EXAM: CT ABDOMEN AND PELVIS WITH CONTRAST  TECHNIQUE: Multidetector CT imaging of the abdomen and pelvis was performed using the standard protocol following bolus administration of intravenous contrast. CONTRAST:  OMNIPAQUE IOHEXOL 300 MG/ML  SOLN COMPARISON:  Abdominal ultrasound dated 12/18/2020. CT abdomen pelvis dated 12/08/2017. FINDINGS: Lower chest: The visualized lung bases are clear. There is coronary vascular calcification. No intra-abdominal free air or free fluid. Hepatobiliary: Subcentimeter hepatic hypodense focus is too small to characterize. No intrahepatic biliary dilatation. No calcified gallstone or pericholecystic fluid. Pancreas: Unremarkable. No pancreatic ductal dilatation or surrounding inflammatory changes. Spleen: Normal in size without focal abnormality. Adrenals/Urinary Tract: The adrenal glands are unremarkable. The kidneys, visualized ureters, and urinary bladder appear unremarkable. Stomach/Bowel: There is no bowel obstruction or active inflammation. The appendix is normal. Vascular/Lymphatic: Moderate aortoiliac atherosclerotic disease. The IVC is unremarkable. No portal venous gas. There is no adenopathy. Reproductive: The prostate and seminal vesicles are grossly unremarkable. No pelvic mass. Other: None Musculoskeletal: Degenerative changes of the spine. No acute osseous pathology. IMPRESSION: 1. No acute intra-abdominal or pelvic pathology. 2. Aortic Atherosclerosis (ICD10-I70.0). Electronically Signed   By: Elgie Collard M.D.   On: 08/22/2021 01:19    Assessment/Plan Principal Problem:   Acute hepatitis Active Problems:   Primary osteoarthritis of both knees   Polyarthritis   Chronic pain syndrome   Essential familial hypercholesterolemia   Essential hypertension, benign   Diabetes mellitus without complication (HCC)   High risk medication use   Fever   Sepsis (HCC)   Abdominal pain   Lactic acidosis   Elevated liver enzymes   Hyperbilirubinemia   AKI (acute kidney injury) (HCC)  Sepsis of  unknown cause -Continue sepsis protocol fluid resuscitation -Continue broad-spectrum antibiotics covering intra-abdominal infection -Follow lactic acidosis to resolution -Follow blood cultures  Acute hepatitis -Cause unknown at this time -Supportive management ordered -Acute hepatitis panel pending -Hold atorvastatin at this time -Check lipid panel -Add urine drug screen -Clear liquid diet -GI consultation requested -Daily CMP and PT/INR testing  Inappropriate sinus tachycardia -He has been followed by cardiologist -He is stable on metoprolol 25 mg BID -telemetry monitoring ordered  Accelerated hypertension -resuming home metoprolol for now -IV hydralazine ordered PRN -hopefully can add back amlodipine soon  AKI -presumed, no recent labs to compare -recheck in AM -IV fluids ordered  Type 2 diabetes mellitus -Hold home oral medications at this time -SSI coverage and frequent CBG monitoring ordered -No concentrated sweets or fruit juices except to treat a  low blood glucose  Hyperbilirubinemia -daily CMP testing -supportive management  Osteoarthritis/polyarthritis -holding all acetaminophen products at this time -low dose oxycodone ordered for pain symptoms  Generalized and Epigastric abdominal pain -fortunately pain symptoms improved after vomiting -clear liquid diet at this time -GI consultation requested   Familial  hypercholesterolemia -HOLDING ATORVASTATIN temporarily -Follow up lipid panel   DVT prophylaxis: SQ heparin   Code Status: Full   Family Communication: patient updated at bedside   Disposition Plan: TBD  Consults called: GI   Admission status: INP   Level of care: Telemetry Standley Dakins MD [facility] How to contact the [facility] Attending or Consulting provider 7A - 7P or covering provider during after hours 7P -7A, for this patient?  Check the care team in [facility] and look for a) attending/consulting [facility] provider listed and b) the [facility]  team listed Log into [email or web address] and use [place]'s universal password to access. If you do not have the password, please contact the hospital operator. Locate the [facility] provider you are looking for under [facility] and page to a number that you can be directly reached. If you still have difficulty reaching the provider, please page the [facility] (Director on Call) for the Hospitalists listed on amion for assistance.   If 7PM-7AM, please contact night-coverage [email or web address] Password [facility]  08/22/2021, 7:34 AM

## 2021-08-22 NOTE — ED Notes (Signed)
Upon assessment pt in regular clothes. Pt placed into pt gown, grip socks, cardiac monitor & pulse ox in place with BP cycling q 30 minutes.

## 2021-08-22 NOTE — Sepsis Progress Note (Signed)
Spoke with bedside RN via secure chat regarding need for repeat lactic order.  Message sent to MD at 0230 but no order yet.  RN to obtain order.

## 2021-08-23 DIAGNOSIS — Z79899 Other long term (current) drug therapy: Secondary | ICD-10-CM

## 2021-08-23 LAB — COMPREHENSIVE METABOLIC PANEL
ALT: 1145 U/L — ABNORMAL HIGH (ref 0–44)
AST: 327 U/L — ABNORMAL HIGH (ref 15–41)
Albumin: 3.8 g/dL (ref 3.5–5.0)
Alkaline Phosphatase: 147 U/L — ABNORMAL HIGH (ref 38–126)
Anion gap: 7 (ref 5–15)
BUN: 9 mg/dL (ref 8–23)
CO2: 24 mmol/L (ref 22–32)
Calcium: 9.2 mg/dL (ref 8.9–10.3)
Chloride: 106 mmol/L (ref 98–111)
Creatinine, Ser: 0.74 mg/dL (ref 0.61–1.24)
GFR, Estimated: >60 mL/min (ref >60)
Glucose, Bld: 101 mg/dL — ABNORMAL HIGH (ref 70–99)
Potassium: 3.3 mmol/L — ABNORMAL LOW (ref 3.5–5.1)
Sodium: 137 mmol/L (ref 135–145)
Total Bilirubin: 2 mg/dL — ABNORMAL HIGH (ref 0.3–1.2)
Total Protein: 6.2 g/dL — ABNORMAL LOW (ref 6.5–8.1)

## 2021-08-23 LAB — HEPATITIS PANEL, ACUTE
HCV Ab: NONREACTIVE
Hep A IgM: NONREACTIVE
Hep B C IgM: NONREACTIVE
Hepatitis B Surface Ag: NONREACTIVE

## 2021-08-23 LAB — GLUCOSE, CAPILLARY
Glucose-Capillary: 100 mg/dL — ABNORMAL HIGH (ref 70–99)
Glucose-Capillary: 109 mg/dL — ABNORMAL HIGH (ref 70–99)
Glucose-Capillary: 100 mg/dL — ABNORMAL HIGH (ref 70–99)
Glucose-Capillary: 126 mg/dL — ABNORMAL HIGH (ref 70–99)

## 2021-08-23 LAB — PROTIME-INR
INR: 1.2 (ref 0.8–1.2)
Prothrombin Time: 14.9 seconds (ref 11.4–15.2)

## 2021-08-23 LAB — CBC
HCT: 37.8 % — ABNORMAL LOW (ref 39.0–52.0)
Hemoglobin: 12.6 g/dL — ABNORMAL LOW (ref 13.0–17.0)
MCH: 29 pg (ref 26.0–34.0)
MCHC: 33.3 g/dL (ref 30.0–36.0)
MCV: 87.1 fL (ref 80.0–100.0)
Platelets: 193 10*3/uL (ref 150–400)
RBC: 4.34 MIL/uL (ref 4.22–5.81)
RDW: 13.2 % (ref 11.5–15.5)
WBC: 6.9 10*3/uL (ref 4.0–10.5)
nRBC: 0 % (ref 0.0–0.2)

## 2021-08-23 LAB — MAGNESIUM: Magnesium: 1.5 mg/dL — ABNORMAL LOW (ref 1.7–2.4)

## 2021-08-23 MED ORDER — PNEUMOCOCCAL VAC POLYVALENT 25 MCG/0.5ML IJ INJ
0.5000 mL | INJECTION | INTRAMUSCULAR | Status: AC
  Administered 2021-08-24: 0.5 mL via INTRAMUSCULAR
  Filled 2021-08-23: qty 0.5

## 2021-08-23 MED ORDER — LOSARTAN POTASSIUM 50 MG PO TABS
25.0000 mg | ORAL_TABLET | Freq: Every day | ORAL | Status: DC
  Administered 2021-08-23 – 2021-08-24 (×2): 25 mg via ORAL
  Filled 2021-08-23 (×2): qty 1

## 2021-08-23 MED ORDER — MAGNESIUM SULFATE 4 GM/100ML IV SOLN
4.0000 g | Freq: Once | INTRAVENOUS | Status: AC
  Administered 2021-08-23: 4 g via INTRAVENOUS
  Filled 2021-08-23: qty 100

## 2021-08-23 MED ORDER — ASPIRIN EC 81 MG PO TBEC
81.0000 mg | DELAYED_RELEASE_TABLET | Freq: Every day | ORAL | Status: DC
  Administered 2021-08-23 – 2021-08-24 (×2): 81 mg via ORAL
  Filled 2021-08-23 (×2): qty 1

## 2021-08-23 MED ORDER — INFLUENZA VAC A&B SA ADJ QUAD 0.5 ML IM PRSY
0.5000 mL | PREFILLED_SYRINGE | INTRAMUSCULAR | Status: AC
  Administered 2021-08-24: 0.5 mL via INTRAMUSCULAR
  Filled 2021-08-23: qty 0.5

## 2021-08-23 MED ORDER — POTASSIUM CHLORIDE CRYS ER 20 MEQ PO TBCR
40.0000 meq | EXTENDED_RELEASE_TABLET | Freq: Once | ORAL | Status: AC
  Administered 2021-08-23: 40 meq via ORAL
  Filled 2021-08-23: qty 2

## 2021-08-23 MED ORDER — AMLODIPINE BESYLATE 5 MG PO TABS
5.0000 mg | ORAL_TABLET | Freq: Every day | ORAL | Status: DC
  Administered 2021-08-23 – 2021-08-24 (×2): 5 mg via ORAL
  Filled 2021-08-23 (×2): qty 1

## 2021-08-23 NOTE — Progress Notes (Signed)
PROGRESS NOTE   Dustin Monroe  SAY:[id number] DOB: 1945/11/28 DOA: 08/21/2021 PCP: Jason Coop, FNP   Chief Complaint  Patient presents with   Abdominal Pain   Level of care: Med-Surg  Brief Admission History:  75 y.o. male with medical history significant for inappropriate sinus tachycardia, accelerated hypertension, managed by cardiology, type 2 diabetes mellitus, hyperlipidemia, GERD, DJD and osteoarthritis presented to the emergency department yesterday with complaints of abdominal pain that has been present for about 5 hours prior to arrival.  He reported generalized abdominal pain.  He also had fever nausea and emesis.   He was admitted with a presumed acute hepatitis with a marked transaminitis of unclear cause.  GI has been consulted and following.  Pt had sepsis criteria on presentation and initially covered with broad spectrum antibiotics.   Assessment & Plan:   Principal Problem:   Acute hepatitis Active Problems:   Accelerated hypertension   Lactic acidosis   Inappropriate sinus tachycardia   Primary osteoarthritis of both knees   Polyarthritis   Chronic pain syndrome   Essential familial hypercholesterolemia   Diabetes mellitus without complication (HCC)   High risk medication use   Fever   Sepsis (HCC)   Abdominal pain   Elevated liver enzymes   Hyperbilirubinemia   AKI (acute kidney injury) (HCC)   Acute Hepatitis / Acute transaminitis - LFTs trending down with supportive measures  - acute hepatitis panel negative  - continue supportive measures  - trend LFTs   Inappropriate sinus tachycardia  - he has been followed by Dr. Purvis Sheffield - cardiology - stable on metoprolol 25 mg BID  Accelerated hypertension  - resume metoprolol, amlodipine, losartan - careful not to drop BP too low  Prerenal AKI - improved with IV fluid hydration  Hyperbilirubinemia - trending down with supportive care  Type 2 diabetes mellitus - continue SSI  coverage and CBG monitoring - holding home oral medications  Osteoarthritis/polyarthritis - holding all acetaminophen at this time given acute hepatitis  Epigastric abdominal pain - improved at this time, follow clinically - tolerating soft diet at this time    DVT prophylaxis: SQ heparin  Code Status: full  Family Communication: patient updated at bedside Disposition: home  Status is: Inpatient  Remains inpatient appropriate because: IV fluid required    Consultants:  GI   Procedures:  N/a  Antimicrobials:  Metronidazole 12/10>> Ceftriaxone 12/10>>   Subjective: Pt reports that he is feeling a little better, no fever or chills, no more abdominal pain. No pruritus.   Objective: Vitals:   08/22/21 1522 08/22/21 2111 08/23/21 0652 08/23/21 0809  BP: (!) 151/89 (!) 139/96 (!) 152/95 132/82  Pulse: 89 94 87 91  Resp: 18 20 17    Temp: 98.9 F (37.2 C) 98.6 F (37 C) 98 F (36.7 C)   TempSrc: Oral Oral Oral   SpO2: 99% 99% 97%   Weight:   81.8 kg   Height:        Intake/Output Summary (Last 24 hours) at 08/23/2021 0915 Last data filed at 08/23/2021 0700 Gross per 24 hour  Intake 2961.07 ml  Output 1800 ml  Net 1161.07 ml   Filed Weights   08/21/21 2033 08/23/21 0652  Weight: 81.6 kg 81.8 kg    Examination:  General exam: Appears calm and comfortable. Mild jaundice seen.  Respiratory system: Clear to auscultation. Respiratory effort normal. Cardiovascular system: normal S1 & S2 heard. No JVD, murmurs, rubs, gallops or clicks. No pedal edema. Gastrointestinal system:  Abdomen is nondistended, soft and nontender. No organomegaly or masses felt. Normal bowel sounds heard. Central nervous system: Alert and oriented. No focal neurological deficits. Extremities: Symmetric 5 x 5 power. Skin: No rashes, lesions or ulcers Psychiatry: Judgement and insight appear normal. Mood & affect appropriate.   Data Reviewed: I have personally reviewed following labs and  imaging studies  CBC: Recent Labs  Lab 08/21/21 2059 08/22/21 0743 08/23/21 0433  WBC 7.5 8.4 6.9  NEUTROABS  --  6.5  --   HGB 14.0 12.6* 12.6*  HCT 42.4 37.2* 37.8*  MCV 88.5 86.1 87.1  PLT 244 209 193    Basic Metabolic Panel: Recent Labs  Lab 08/21/21 2059 08/22/21 0743 08/23/21 0433  NA 134*  --  137  K 4.4  --  3.3*  CL 98  --  106  CO2 24  --  24  GLUCOSE 177*  --  101*  BUN 22  --  9  CREATININE 1.36* 0.82 0.74  CALCIUM 9.8  --  9.2  MG  --   --  1.5*    GFR: Estimated Creatinine Clearance: 79.8 mL/min (by C-G formula based on SCr of 0.74 mg/dL).  Liver Function Tests: Recent Labs  Lab 08/21/21 2059 08/23/21 0433  AST 3,225* 327*  ALT 2,504* 1,145*  ALKPHOS 169* 147*  BILITOT 2.2* 2.0*  PROT 7.3 6.2*  ALBUMIN 4.5 3.8    CBG: Recent Labs  Lab 08/22/21 0813 08/23/21 0723  GLUCAP 101* 100*    Recent Results (from the past 240 hour(s))  Resp Panel by RT-PCR (Flu A&B, Covid) Nasopharyngeal Swab     Status: None   Collection Time: 08/22/21 12:30 AM   Specimen: Nasopharyngeal Swab; Nasopharyngeal(NP) swabs in vial transport medium  Result Value Ref Range Status   SARS Coronavirus 2 by RT PCR NEGATIVE NEGATIVE Final    Comment: (NOTE) SARS-CoV-2 target nucleic acids are NOT DETECTED.  The SARS-CoV-2 RNA is generally detectable in upper respiratory specimens during the acute phase of infection. The lowest concentration of SARS-CoV-2 viral copies this assay can detect is 138 copies/mL. A negative result does not preclude SARS-Cov-2 infection and should not be used as the sole basis for treatment or other patient management decisions. A negative result may occur with  improper specimen collection/handling, submission of specimen other than nasopharyngeal swab, presence of viral mutation(s) within the areas targeted by this assay, and inadequate number of viral copies(<138 copies/mL). A negative result must be combined with clinical  observations, patient history, and epidemiological information. The expected result is Negative.  Fact Sheet for Patients:  BloggerCourse.com  Fact Sheet for Healthcare Providers:  SeriousBroker.it  This test is no t yet approved or cleared by the [place] FDA and  has been authorized for detection and/or diagnosis of SARS-CoV-2 by FDA under an Emergency Use Authorization (EUA). This EUA will remain  in effect (meaning this test can be used) for the duration of the COVID-19 declaration under Section 564(b)(1) of the Act, 21 U.S.C.section 360bbb-3(b)(1), unless the authorization is terminated  or revoked sooner.       Influenza A by PCR NEGATIVE NEGATIVE Final   Influenza B by PCR NEGATIVE NEGATIVE Final    Comment: (NOTE) The Xpert Xpress SARS-CoV-2/FLU/RSV plus assay is intended as an aid in the diagnosis of influenza from Nasopharyngeal swab specimens and should not be used as a sole basis for treatment. Nasal washings and aspirates are unacceptable for Xpert Xpress SARS-CoV-2/FLU/RSV testing.  Fact Sheet for Patients:  BloggerCourse.com  Fact Sheet for Healthcare Providers: SeriousBroker.it  This test is not yet approved or cleared by the [place] FDA and has been authorized for detection and/or diagnosis of SARS-CoV-2 by FDA under an Emergency Use Authorization (EUA). This EUA will remain in effect (meaning this test can be used) for the duration of the COVID-19 declaration under Section 564(b)(1) of the Act, 21 U.S.C. section 360bbb-3(b)(1), unless the authorization is terminated or revoked.  Performed at [facility], [address] [place], [place] [address]   Culture, blood (single)     Status: None (Preliminary result)   Collection Time: 08/22/21  1:29 AM   Specimen: BLOOD LEFT ARM  Result Value Ref Range Status   Specimen Description BLOOD LEFT ARM   Final   Special Requests   Final    BOTTLES DRAWN AEROBIC AND ANAEROBIC Blood Culture results may not be optimal due to an inadequate volume of blood received in culture bottles   Culture   Final    NO GROWTH 1 DAY Performed at [facility], [address], [place], [place] [address]    Report Status PENDING  Incomplete     Radiology Studies: CT ABDOMEN PELVIS W CONTRAST  Result Date: 08/22/2021 CLINICAL DATA:  Abdominal pain. EXAM: CT ABDOMEN AND PELVIS WITH CONTRAST TECHNIQUE: Multidetector CT imaging of the abdomen and pelvis was performed using the standard protocol following bolus administration of intravenous contrast. CONTRAST:  OMNIPAQUE IOHEXOL 300 MG/ML  SOLN COMPARISON:  Abdominal ultrasound dated 12/18/2020. CT abdomen pelvis dated 12/08/2017. FINDINGS: Lower chest: The visualized lung bases are clear. There is coronary vascular calcification. No intra-abdominal free air or free fluid. Hepatobiliary: Subcentimeter hepatic hypodense focus is too small to characterize. No intrahepatic biliary dilatation. No calcified gallstone or pericholecystic fluid. Pancreas: Unremarkable. No pancreatic ductal dilatation or surrounding inflammatory changes. Spleen: Normal in size without focal abnormality. Adrenals/Urinary Tract: The adrenal glands are unremarkable. The kidneys, visualized ureters, and urinary bladder appear unremarkable. Stomach/Bowel: There is no bowel obstruction or active inflammation. The appendix is normal. Vascular/Lymphatic: Moderate aortoiliac atherosclerotic disease. The IVC is unremarkable. No portal venous gas. There is no adenopathy. Reproductive: The prostate and seminal vesicles are grossly unremarkable. No pelvic mass. Other: None Musculoskeletal: Degenerative changes of the spine. No acute osseous pathology. IMPRESSION: 1. No acute intra-abdominal or pelvic pathology. 2. Aortic Atherosclerosis (ICD10-I70.0). Electronically Signed   By: Elgie Collard M.D.   On:  08/22/2021 01:19    Scheduled Meds:  heparin  5,000 Units Subcutaneous Q8H   insulin aspart  0-15 Units Subcutaneous TID WC   metoprolol tartrate  25 mg Oral BID   potassium chloride  40 mEq Oral Once   Continuous Infusions:  sodium chloride 100 mL/hr at 08/23/21 0636   cefTRIAXone (ROCEPHIN)  IV 2 g (08/22/21 1221)   magnesium sulfate bolus IVPB 4 g (08/23/21 0812)   metronidazole 500 mg (08/23/21 0651)     LOS: 1 day   Time spent: 35 mins   Keena Heesch Laural Benes, MD How to contact the [facility] Attending or Consulting provider 7A - 7P or covering provider during after hours 7P -7A, for this patient?  Check the care team in [facility] and look for a) attending/consulting [facility] provider listed and b) the [facility] team listed Log into [email or web address] and use [place]'s universal password to access. If you do not have the password, please contact the hospital operator. Locate the [facility] provider you are looking for under [facility] and page to a number that  you can be directly reached. If you still have difficulty reaching the provider, please page the [facility] (Director on Call) for the Hospitalists listed on amion for assistance.  08/23/2021, 9:15 AM

## 2021-08-23 NOTE — Progress Notes (Addendum)
Subjective: Patient without complaints today.  Notes his abdominal pain is vastly improved.  Tolerating diet without any issues.  LFTs much improved today.  INR WNL  Objective: Vital signs in last 24 hours: Temp:  [98 F (36.7 C)-98.9 F (37.2 C)] 98 F (36.7 C) (12/11 0652) Pulse Rate:  [87-94] 91 (12/11 0809) Resp:  [17-20] 17 (12/11 5361) BP: (132-152)/(82-96) 132/82 (12/11 0809) SpO2:  [97 %-99 %] 97 % (12/11 0652) Weight:  [81.8 kg] 81.8 kg (12/11 0652) Last BM Date: 08/23/21 General:   Alert and oriented, pleasant Head:  Normocephalic and atraumatic. Eyes:  No icterus, sclera clear. Conjuctiva pink.  Abdomen:  Bowel sounds present, soft, non-tender, non-distended. No HSM or hernias noted. No rebound or guarding. No masses appreciated  Msk:  Symmetrical without gross deformities. Normal posture. Extremities:  Without clubbing or edema. Neurologic:  Alert and  oriented x4;  grossly normal neurologically. Skin:  Warm and dry, intact without significant lesions.  Cervical Nodes:  No significant cervical adenopathy. Psych:  Alert and cooperative. Normal mood and affect.  Intake/Output from previous day: 12/10 0701 - 12/11 0700 In: 3961.1 [P.O.:720; I.V.:3033.5; IV Piggyback:207.6] Out: 2700 [Urine:2700] Intake/Output this shift: Total I/O In: 240 [P.O.:240] Out: -   Lab Results: Recent Labs    08/21/21 2059 08/22/21 0743 08/23/21 0433  WBC 7.5 8.4 6.9  HGB 14.0 12.6* 12.6*  HCT 42.4 37.2* 37.8*  PLT 244 209 193   BMET Recent Labs    08/21/21 2059 08/22/21 0743 08/23/21 0433  NA 134*  --  137  K 4.4  --  3.3*  CL 98  --  106  CO2 24  --  24  GLUCOSE 177*  --  101*  BUN 22  --  9  CREATININE 1.36* 0.82 0.74  CALCIUM 9.8  --  9.2   LFT Recent Labs    08/21/21 2059 08/23/21 0433  PROT 7.3 6.2*  ALBUMIN 4.5 3.8  AST 3,225* 327*  ALT 2,504* 1,145*  ALKPHOS 169* 147*  BILITOT 2.2* 2.0*   PT/INR Recent Labs    08/21/21 2322 08/23/21 0433  LABPROT  14.2 14.9  INR 1.1 1.2   Hepatitis Panel Recent Labs    08/21/21 2322  HEPBSAG NON REACTIVE  HCVAB NON REACTIVE  HEPAIGM NON REACTIVE  HEPBIGM NON REACTIVE     Studies/Results: CT ABDOMEN PELVIS W CONTRAST  Result Date: 08/22/2021 CLINICAL DATA:  Abdominal pain. EXAM: CT ABDOMEN AND PELVIS WITH CONTRAST TECHNIQUE: Multidetector CT imaging of the abdomen and pelvis was performed using the standard protocol following bolus administration of intravenous contrast. CONTRAST:  OMNIPAQUE IOHEXOL 300 MG/ML  SOLN COMPARISON:  Abdominal ultrasound dated 12/18/2020. CT abdomen pelvis dated 12/08/2017. FINDINGS: Lower chest: The visualized lung bases are clear. There is coronary vascular calcification. No intra-abdominal free air or free fluid. Hepatobiliary: Subcentimeter hepatic hypodense focus is too small to characterize. No intrahepatic biliary dilatation. No calcified gallstone or pericholecystic fluid. Pancreas: Unremarkable. No pancreatic ductal dilatation or surrounding inflammatory changes. Spleen: Normal in size without focal abnormality. Adrenals/Urinary Tract: The adrenal glands are unremarkable. The kidneys, visualized ureters, and urinary bladder appear unremarkable. Stomach/Bowel: There is no bowel obstruction or active inflammation. The appendix is normal. Vascular/Lymphatic: Moderate aortoiliac atherosclerotic disease. The IVC is unremarkable. No portal venous gas. There is no adenopathy. Reproductive: The prostate and seminal vesicles are grossly unremarkable. No pelvic mass. Other: None Musculoskeletal: Degenerative changes of the spine. No acute osseous pathology. IMPRESSION: 1. No acute intra-abdominal or pelvic  pathology. 2. Aortic Atherosclerosis (ICD10-I70.0). Electronically Signed   By: Elgie Collard M.D.   On: 08/22/2021 01:19    Impression: *Acute hepatitis *Sepsis-source unclear *Abdominal pain-improved   Plan: Etiology of patient's acute hepatitis unclear.  Given  his significantly elevated aminotransferases, differential includes drug-induced hepatitis, shock liver, viral hepatitis, sepsis.  Fortunately his synthetic function remains intact with normal INR.   LFTs vastly improved today.  Would continue supportive care at this time.     Viral hepatitis panel negative   Patient does chronically take Norco 3 times daily though he states he does not take any additional Tylenol.  Acetaminophen level reassuring.   Continue to monitor daily CMP and coags.  Can consider IV NAC if his INR worsens.  Would hold off for now.   Sepsis management per hospitalist.  Abdominal pain improved.  States he has a slight discomfort in his right lower quadrant but this is much better compared to initial presentation.  CT scan reassuring.  Continue to monitor for now.  Tolerating soft diet.  GI to continue to follow.   Hennie Duos. Marletta Lor, D.O. Gastroenterology and Hepatology [facility]  LOS: 1 day    08/23/2021, 12:39 PM

## 2021-08-24 ENCOUNTER — Other Ambulatory Visit: Payer: Self-pay

## 2021-08-24 ENCOUNTER — Telehealth: Payer: Self-pay

## 2021-08-24 ENCOUNTER — Telehealth: Payer: Self-pay | Admitting: Gastroenterology

## 2021-08-24 DIAGNOSIS — R7989 Other specified abnormal findings of blood chemistry: Secondary | ICD-10-CM

## 2021-08-24 DIAGNOSIS — N179 Acute kidney failure, unspecified: Secondary | ICD-10-CM

## 2021-08-24 LAB — GLUCOSE, CAPILLARY
Glucose-Capillary: 113 mg/dL — ABNORMAL HIGH (ref 70–99)
Glucose-Capillary: 103 mg/dL — ABNORMAL HIGH (ref 70–99)
Glucose-Capillary: 99 mg/dL (ref 70–99)
Glucose-Capillary: 86 mg/dL (ref 70–99)
Glucose-Capillary: 113 mg/dL — ABNORMAL HIGH (ref 70–99)

## 2021-08-24 LAB — COMPREHENSIVE METABOLIC PANEL
ALT: 687 U/L — ABNORMAL HIGH (ref 0–44)
AST: 96 U/L — ABNORMAL HIGH (ref 15–41)
Albumin: 3.4 g/dL — ABNORMAL LOW (ref 3.5–5.0)
Alkaline Phosphatase: 131 U/L — ABNORMAL HIGH (ref 38–126)
Anion gap: 4 — ABNORMAL LOW (ref 5–15)
BUN: 10 mg/dL (ref 8–23)
CO2: 24 mmol/L (ref 22–32)
Calcium: 8.6 mg/dL — ABNORMAL LOW (ref 8.9–10.3)
Chloride: 108 mmol/L (ref 98–111)
Creatinine, Ser: 0.84 mg/dL (ref 0.61–1.24)
GFR, Estimated: >60 mL/min (ref >60)
Glucose, Bld: 88 mg/dL (ref 70–99)
Potassium: 4 mmol/L (ref 3.5–5.1)
Sodium: 136 mmol/L (ref 135–145)
Total Bilirubin: 0.7 mg/dL (ref 0.3–1.2)
Total Protein: 6.2 g/dL — ABNORMAL LOW (ref 6.5–8.1)

## 2021-08-24 LAB — CBC
HCT: 35.5 % — ABNORMAL LOW (ref 39.0–52.0)
Hemoglobin: 11.8 g/dL — ABNORMAL LOW (ref 13.0–17.0)
MCH: 29.4 pg (ref 26.0–34.0)
MCHC: 33.2 g/dL (ref 30.0–36.0)
MCV: 88.3 fL (ref 80.0–100.0)
Platelets: 200 10*3/uL (ref 150–400)
RBC: 4.02 MIL/uL — ABNORMAL LOW (ref 4.22–5.81)
RDW: 13.4 % (ref 11.5–15.5)
WBC: 5.8 10*3/uL (ref 4.0–10.5)
nRBC: 0 % (ref 0.0–0.2)

## 2021-08-24 LAB — PROTIME-INR
INR: 1 (ref 0.8–1.2)
Prothrombin Time: 13.4 seconds (ref 11.4–15.2)

## 2021-08-24 LAB — MAGNESIUM: Magnesium: 1.9 mg/dL (ref 1.7–2.4)

## 2021-08-24 MED ORDER — HYDROCODONE-ACETAMINOPHEN 7.5-325 MG PO TABS: 1.0000 | ORAL_TABLET | Freq: Three times a day (TID) | ORAL | 0 refills | Status: DC

## 2021-08-24 MED ORDER — LOSARTAN POTASSIUM 25 MG PO TABS: 25.0000 mg | ORAL_TABLET | Freq: Every day | ORAL | 1 refills | Status: DC

## 2021-08-24 MED ORDER — ATORVASTATIN CALCIUM 20 MG PO TABS: 20.0000 mg | ORAL_TABLET | Freq: Every day | ORAL | Status: AC

## 2021-08-24 NOTE — Telephone Encounter (Signed)
Sister called office, pt was discharged from hospital today. She was told he needed f/u in 1 week. She said he currently has appt 10/01/21. Informed her I would have him placed on cancellation list, but otherwise he should keep appt that's already scheduled.  Misty Stanley, please add to cancellation list.

## 2021-08-24 NOTE — Telephone Encounter (Signed)
NOTED  Labs have been placed for the pt. Will call later today once he is home and advise of labs placed for Friday.

## 2021-08-24 NOTE — Discharge Summary (Signed)
Physician Discharge Summary  RITO LECOMTE PYK:[id number] DOB: 01-Dec-1945 DOA: 08/21/2021  PCP: Jason Coop, FNP GI: [facility] Dr. Marletta Lor  Admit date: 08/21/2021 Discharge date: 08/24/2021  Admitted From:  HOME  Disposition: HOME   Recommendations for Outpatient Follow-up:  Follow up with PCP in 2 weeks Follow up with [facility] for repeat LFTs later this week as arranged Please stop trazodone as it can cause hepatotoxicity  Discharge Condition: STABLE   CODE STATUS: FULL DIET: resume prior home diet    Brief Hospitalization Summary: Please see all hospital notes, images, labs for full details of the hospitalization. ADMISSION HPI:   75 y.o. male with medical history significant for inappropriate sinus tachycardia, accelerated hypertension, managed by cardiology, type 2 diabetes mellitus, hyperlipidemia, GERD, DJD and osteoarthritis presented to the emergency department yesterday with complaints of abdominal pain that has been present for about 5 hours prior to arrival.  He reported generalized abdominal pain.  He also had fever nausea and emesis.  He said that this was associated with abdominal distention.  He was having some shortness of breath with these symptoms.  He only vomited once but his nausea has been severe.  He reported that after vomiting all of the food that he ate yesterday he reports that he felt better he has been more weak in the last 24 hours.  He reportedly had a negative abdominal ultrasound and was scheduled to see a gastroenterologist in January.  He says that his symptoms had resolved by the time he arrived in the ED.   ED Course: Patient spiked a fever of 101.7, sodium was 134, glucose 177, creatinine 1.36, AST 3225, ALT 2504, total bilirubin 2.2, lactic acid 3.1, WBC 7.5, hemoglobin 14.0, platelet count 244.,  Ammonia 32.,  INR 1.1, PT 14.2, acetaminophen level less than 10, salicylate level less than 7.0, urinalysis unremarkable, patient  started on sepsis protocol with IV antibiotics broad-spectrum coverage started.  CT of the abdomen and pelvis with contrast negative for any acute findings.  GI consulted and stated that okay to admit to [facility] and will consult.  HOSPITAL COURSE BY PROBLEM LIST   Acute Hepatitis / Acute transaminitis - possible hepatotoxicity from high dose trazodone  - LFTs trending down with supportive measures  - acute hepatitis panel negative  - continue supportive measures  - trend LFTs with [facility] to resolution - [facility] arranged for oupatient LFT testing in 4 days    Inappropriate sinus tachycardia  - he has been followed by Dr. Purvis Sheffield - cardiology - stable on metoprolol 25 mg BID - he has been lost to follow up since Dr. Kirtland Bouchard left the practice, new referral placed   Accelerated hypertension  - resume metoprolol, amlodipine, losartan - careful not to drop BP too low - we have reduced dose of losartan to 25 mg and BPs have been well controlled   Prerenal AKI - RESOLVED - improved with IV fluid hydration   Hyperbilirubinemia - trending down with supportive care   Type 2 diabetes mellitus - continue SSI coverage and CBG monitoring - resuming home oral medications at discharge   Osteoarthritis/polyarthritis - holding all acetaminophen temporarily as liver recovers   Epigastric abdominal pain -  RESOLVED  - improved at this time, follow clinically - tolerating soft diet at this time      DVT prophylaxis: SQ heparin  Code Status: full  Family Communication: patient updated at bedside Disposition: home  Status is: Inpatient  Remains inpatient appropriate because: IV fluid required     Consultants:  GI   Discharge Diagnoses:  Principal Problem:   Acute hepatitis Active Problems:   Accelerated hypertension   Lactic acidosis   Inappropriate sinus tachycardia   Primary osteoarthritis of both knees   Polyarthritis   Chronic pain syndrome   Essential  familial hypercholesterolemia   Diabetes mellitus without complication (HCC)   High risk medication use   Fever   Sepsis (HCC)   Abdominal pain   Elevated liver enzymes   Hyperbilirubinemia   AKI (acute kidney injury) [facility])   Discharge Instructions:  Allergies as of 08/24/2021   No Known Allergies      Medication List     STOP taking these medications    hydrochlorothiazide 25 MG tablet Commonly known as: HYDRODIURIL   hydrOXYzine 25 MG tablet Commonly known as: ATARAX   naproxen sodium 220 MG tablet Commonly known as: ALEVE   traZODone 150 MG tablet Commonly known as: DESYREL       TAKE these medications    amLODipine 5 MG tablet Commonly known as: NORVASC Take 1 tablet (5 mg total) by mouth daily.   aspirin EC 81 MG tablet Take 81 mg by mouth daily.   atorvastatin 20 MG tablet Commonly known as: LIPITOR Take 1 tablet (20 mg total) by mouth daily at 6 PM. Start taking on: August 31, 2021 What changed: These instructions start on August 31, 2021. If you are unsure what to do until then, ask your doctor or other care provider.   esomeprazole 20 MG packet Commonly known as: NEXIUM Take 20 mg by mouth daily before breakfast.   HYDROcodone-acetaminophen 7.5-325 MG tablet Commonly known as: NORCO Take 1 tablet by mouth 3 (three) times daily. Start taking on: August 31, 2021 What changed: These instructions start on August 31, 2021. If you are unsure what to do until then, ask your doctor or other care provider.   losartan 25 MG tablet Commonly known as: COZAAR Take 1 tablet (25 mg total) by mouth daily. What changed:  medication strength how much to take   metFORMIN 1000 MG tablet Commonly known as: GLUCOPHAGE Take 1,000 mg by mouth daily after supper.   metoprolol tartrate 25 MG tablet Commonly known as: LOPRESSOR Take 1 tablet by mouth twice daily What changed:  how much to take how to take this when to take this additional  instructions        Follow-up Information     Jason Coop, FNP. Schedule an appointment as soon as possible for a visit in 2 week(s).   Specialty: Family Medicine Why: Hospital Follow Up Contact information: [facility] [address] [place] [address] [phone]         Lanelle Bal, DO. Schedule an appointment as soon as possible for a visit in 1 week(s).   Specialty: Gastroenterology Why: Hospital Follow Up Contact information: [address] [place] [address] [phone]         [facility]. Go to.   Why: THEY WILL CALL YOU WITH APPOINTMENT Contact information: [address] [place] [address] [phone]               No Known Allergies Allergies as of 08/24/2021   No Known Allergies      Medication List     STOP taking these medications    hydrochlorothiazide 25 MG tablet Commonly known as: HYDRODIURIL  hydrOXYzine 25 MG tablet Commonly known as: ATARAX   naproxen sodium 220 MG tablet Commonly known as: ALEVE   traZODone 150 MG tablet Commonly known as: DESYREL       TAKE these medications    amLODipine 5 MG tablet Commonly known as: NORVASC Take 1 tablet (5 mg total) by mouth daily.   aspirin EC 81 MG tablet Take 81 mg by mouth daily.   atorvastatin 20 MG tablet Commonly known as: LIPITOR Take 1 tablet (20 mg total) by mouth daily at 6 PM. Start taking on: August 31, 2021 What changed: These instructions start on August 31, 2021. If you are unsure what to do until then, ask your doctor or other care provider.   esomeprazole 20 MG packet Commonly known as: NEXIUM Take 20 mg by mouth daily before breakfast.   HYDROcodone-acetaminophen 7.5-325 MG tablet Commonly known as: NORCO Take 1 tablet by mouth 3 (three) times daily. Start taking on: August 31, 2021 What changed: These instructions start on August 31, 2021. If you are unsure what to do until then, ask your doctor or other care provider.   losartan 25 MG tablet Commonly known as: COZAAR Take 1 tablet (25 mg total) by mouth daily. What changed:  medication strength how much to take   metFORMIN 1000 MG tablet Commonly known as: GLUCOPHAGE Take 1,000 mg by mouth daily after supper.   metoprolol tartrate 25 MG tablet Commonly known as: LOPRESSOR Take 1 tablet by mouth twice daily What changed:  how much to take how to take this when to take this additional instructions        Procedures/Studies: CT ABDOMEN PELVIS W CONTRAST  Result Date: 08/22/2021 CLINICAL DATA:  Abdominal pain. EXAM: CT ABDOMEN AND PELVIS WITH CONTRAST TECHNIQUE: Multidetector CT imaging of the abdomen and pelvis was performed using the standard protocol following bolus administration of intravenous contrast. CONTRAST:  OMNIPAQUE IOHEXOL 300 MG/ML  SOLN COMPARISON:  Abdominal ultrasound dated 12/18/2020. CT abdomen pelvis dated 12/08/2017. FINDINGS: Lower chest: The visualized lung bases are clear. There is coronary vascular calcification. No intra-abdominal free air or free fluid. Hepatobiliary: Subcentimeter hepatic hypodense focus is too small to characterize. No intrahepatic biliary dilatation. No calcified gallstone or pericholecystic fluid. Pancreas: Unremarkable. No pancreatic ductal dilatation or surrounding inflammatory changes. Spleen: Normal in size without focal abnormality. Adrenals/Urinary Tract: The adrenal glands are unremarkable. The kidneys, visualized ureters, and urinary bladder appear unremarkable. Stomach/Bowel: There is no bowel obstruction or active inflammation. The appendix is normal. Vascular/Lymphatic: Moderate aortoiliac atherosclerotic disease. The IVC is unremarkable. No portal venous gas. There is no adenopathy. Reproductive: The prostate and seminal vesicles are grossly unremarkable. No pelvic mass. Other: None Musculoskeletal:  Degenerative changes of the spine. No acute osseous pathology. IMPRESSION: 1. No acute intra-abdominal or pelvic pathology. 2. Aortic Atherosclerosis (ICD10-I70.0). Electronically Signed   By: Elgie Collard M.D.   On: 08/22/2021 01:19     Subjective: Pt reports that he is feeling much better and wants to go home today.  No abdominal pain, no rash, no fever and no chills.    Discharge Exam: Vitals:   08/24/21 0621 08/24/21 0822  BP: 133/62 131/67  Pulse: 74 75  Resp: 18   Temp: 97.7 F (36.5 C)   SpO2: 96%    Vitals:   08/23/21 2044 08/24/21 0500 08/24/21 0621 08/24/21 0822  BP: 129/67  133/62 131/67  Pulse: 69  74 75  Resp: 20  18   Temp: 98 F (36.7  C)  97.7 F (36.5 C)   TempSrc: Oral  Oral   SpO2: 98%  96%   Weight:  80.3 kg    Height:       General: Pt is alert, awake, not in acute distress Cardiovascular: normal S1/S2 +, no rubs, no gallops Respiratory: CTA bilaterally, no wheezing, no rhonchi Abdominal: Soft, NT, ND, bowel sounds + Extremities: no edema, no cyanosis   The results of significant diagnostics from this hospitalization (including imaging, microbiology, ancillary and laboratory) are listed below for reference.     Microbiology: Recent Results (from the past 240 hour(s))  Resp Panel by RT-PCR (Flu A&B, Covid) Nasopharyngeal Swab     Status: None   Collection Time: 08/22/21 12:30 AM   Specimen: Nasopharyngeal Swab; Nasopharyngeal(NP) swabs in vial transport medium  Result Value Ref Range Status   SARS Coronavirus 2 by RT PCR NEGATIVE NEGATIVE Final    Comment: (NOTE) SARS-CoV-2 target nucleic acids are NOT DETECTED.  The SARS-CoV-2 RNA is generally detectable in upper respiratory specimens during the acute phase of infection. The lowest concentration of SARS-CoV-2 viral copies this assay can detect is 138 copies/mL. A negative result does not preclude SARS-Cov-2 infection and should not be used as the sole basis for treatment or other patient  management decisions. A negative result may occur with  improper specimen collection/handling, submission of specimen other than nasopharyngeal swab, presence of viral mutation(s) within the areas targeted by this assay, and inadequate number of viral copies(<138 copies/mL). A negative result must be combined with clinical observations, patient history, and epidemiological information. The expected result is Negative.  Fact Sheet for Patients:  BloggerCourse.com  Fact Sheet for Healthcare Providers:  SeriousBroker.it  This test is no t yet approved or cleared by the [place] FDA and  has been authorized for detection and/or diagnosis of SARS-CoV-2 by FDA under an Emergency Use Authorization (EUA). This EUA will remain  in effect (meaning this test can be used) for the duration of the COVID-19 declaration under Section 564(b)(1) of the Act, 21 U.S.C.section 360bbb-3(b)(1), unless the authorization is terminated  or revoked sooner.       Influenza A by PCR NEGATIVE NEGATIVE Final   Influenza B by PCR NEGATIVE NEGATIVE Final    Comment: (NOTE) The Xpert Xpress SARS-CoV-2/FLU/RSV plus assay is intended as an aid in the diagnosis of influenza from Nasopharyngeal swab specimens and should not be used as a sole basis for treatment. Nasal washings and aspirates are unacceptable for Xpert Xpress SARS-CoV-2/FLU/RSV testing.  Fact Sheet for Patients: BloggerCourse.com  Fact Sheet for Healthcare Providers: SeriousBroker.it  This test is not yet approved or cleared by the [place] FDA and has been authorized for detection and/or diagnosis of SARS-CoV-2 by FDA under an Emergency Use Authorization (EUA). This EUA will remain in effect (meaning this test can be used) for the duration of the COVID-19 declaration under Section 564(b)(1) of the Act, 21 U.S.C. section 360bbb-3(b)(1),  unless the authorization is terminated or revoked.  Performed at [facility], [address], [place], [place] [address]   Culture, blood (single)     Status: None (Preliminary result)   Collection Time: 08/22/21  1:29 AM   Specimen: BLOOD LEFT ARM  Result Value Ref Range Status   Specimen Description BLOOD LEFT ARM  Final   Special Requests   Final    BOTTLES DRAWN AEROBIC AND ANAEROBIC Blood Culture results may not be optimal due to an inadequate volume of blood received in culture  bottles   Culture   Final    NO GROWTH 2 DAYS Performed at [facility], [address] [place], [place] [address]    Report Status PENDING  Incomplete     Labs: BNP (last 3 results) No results for input(s): BNP in the last 8760 hours. Basic Metabolic Panel: Recent Labs  Lab 08/21/21 2059 08/22/21 0743 08/23/21 0433 08/24/21 0442  NA 134*  --  137 136  K 4.4  --  3.3* 4.0  CL 98  --  106 108  CO2 24  --  24 24  GLUCOSE 177*  --  101* 88  BUN 22  --  9 10  CREATININE 1.36* 0.82 0.74 0.84  CALCIUM 9.8  --  9.2 8.6*  MG  --   --  1.5* 1.9   Liver Function Tests: Recent Labs  Lab 08/21/21 2059 08/23/21 0433 08/24/21 0442  AST 3,225* 327* 96*  ALT 2,504* 1,145* 687*  ALKPHOS 169* 147* 131*  BILITOT 2.2* 2.0* 0.7  PROT 7.3 6.2* 6.2*  ALBUMIN 4.5 3.8 3.4*   Recent Labs  Lab 08/21/21 2059  LIPASE 38   Recent Labs  Lab 08/21/21 2322  AMMONIA 32   CBC: Recent Labs  Lab 08/21/21 2059 08/22/21 0743 08/23/21 0433 08/24/21 0442  WBC 7.5 8.4 6.9 5.8  NEUTROABS  --  6.5  --   --   HGB 14.0 12.6* 12.6* 11.8*  HCT 42.4 37.2* 37.8* 35.5*  MCV 88.5 86.1 87.1 88.3  PLT 244 209 193 200   Cardiac Enzymes: No results for input(s): CKTOTAL, CKMB, CKMBINDEX, TROPONINI in the last 168 hours. BNP: Invalid input(s): POCBNP CBG: Recent Labs  Lab 08/23/21 0723 08/23/21 1131 08/23/21 1633 08/23/21 2048 08/24/21 0722  GLUCAP 100* 109* 100* 126* 86   D-Dimer No results for  input(s): DDIMER in the last 72 hours. Hgb A1c Recent Labs    08/22/21 0743  HGBA1C 5.4   Lipid Profile Recent Labs    08/22/21 0743  CHOL 102  HDL 25*  LDLCALC 57  TRIG 604  CHOLHDL 4.1   Thyroid function studies No results for input(s): TSH, T4TOTAL, T3FREE, THYROIDAB in the last 72 hours.  Invalid input(s): FREET3 Anemia work up No results for input(s): VITAMINB12, FOLATE, FERRITIN, TIBC, IRON, RETICCTPCT in the last 72 hours. Urinalysis    Component Value Date/Time   COLORURINE YELLOW 08/21/2021 2053   APPEARANCEUR HAZY (A) 08/21/2021 2053   LABSPEC 1.025 08/21/2021 2053   PHURINE 6.0 08/21/2021 2053   GLUCOSEU NEGATIVE 08/21/2021 2053   HGBUR TRACE (A) 08/21/2021 2053   BILIRUBINUR NEGATIVE 08/21/2021 2053   KETONESUR NEGATIVE 08/21/2021 2053   PROTEINUR 30 (A) 08/21/2021 2053   NITRITE NEGATIVE 08/21/2021 2053   LEUKOCYTESUR NEGATIVE 08/21/2021 2053   Sepsis Labs Invalid input(s): PROCALCITONIN,  WBC,  LACTICIDVEN Microbiology Recent Results (from the past 240 hour(s))  Resp Panel by RT-PCR (Flu A&B, Covid) Nasopharyngeal Swab     Status: None   Collection Time: 08/22/21 12:30 AM   Specimen: Nasopharyngeal Swab; Nasopharyngeal(NP) swabs in vial transport medium  Result Value Ref Range Status   SARS Coronavirus 2 by RT PCR NEGATIVE NEGATIVE Final    Comment: (NOTE) SARS-CoV-2 target nucleic acids are NOT DETECTED.  The SARS-CoV-2 RNA is generally detectable in upper respiratory specimens during the acute phase of infection. The lowest concentration of SARS-CoV-2 viral copies this assay can detect is 138 copies/mL. A negative result does not preclude SARS-Cov-2 infection and should not be used  as the sole basis for treatment or other patient management decisions. A negative result may occur with  improper specimen collection/handling, submission of specimen other than nasopharyngeal swab, presence of viral mutation(s) within the areas targeted by this  assay, and inadequate number of viral copies(<138 copies/mL). A negative result must be combined with clinical observations, patient history, and epidemiological information. The expected result is Negative.  Fact Sheet for Patients:  BloggerCourse.com  Fact Sheet for Healthcare Providers:  SeriousBroker.it  This test is no t yet approved or cleared by the [place] FDA and  has been authorized for detection and/or diagnosis of SARS-CoV-2 by FDA under an Emergency Use Authorization (EUA). This EUA will remain  in effect (meaning this test can be used) for the duration of the COVID-19 declaration under Section 564(b)(1) of the Act, 21 U.S.C.section 360bbb-3(b)(1), unless the authorization is terminated  or revoked sooner.       Influenza A by PCR NEGATIVE NEGATIVE Final   Influenza B by PCR NEGATIVE NEGATIVE Final    Comment: (NOTE) The Xpert Xpress SARS-CoV-2/FLU/RSV plus assay is intended as an aid in the diagnosis of influenza from Nasopharyngeal swab specimens and should not be used as a sole basis for treatment. Nasal washings and aspirates are unacceptable for Xpert Xpress SARS-CoV-2/FLU/RSV testing.  Fact Sheet for Patients: BloggerCourse.com  Fact Sheet for Healthcare Providers: SeriousBroker.it  This test is not yet approved or cleared by the [place] FDA and has been authorized for detection and/or diagnosis of SARS-CoV-2 by FDA under an Emergency Use Authorization (EUA). This EUA will remain in effect (meaning this test can be used) for the duration of the COVID-19 declaration under Section 564(b)(1) of the Act, 21 U.S.C. section 360bbb-3(b)(1), unless the authorization is terminated or revoked.  Performed at [facility], [address] [place], [place] [address]   Culture, blood (single)     Status: None (Preliminary result)   Collection Time: 08/22/21   1:29 AM   Specimen: BLOOD LEFT ARM  Result Value Ref Range Status   Specimen Description BLOOD LEFT ARM  Final   Special Requests   Final    BOTTLES DRAWN AEROBIC AND ANAEROBIC Blood Culture results may not be optimal due to an inadequate volume of blood received in culture bottles   Culture   Final    NO GROWTH 2 DAYS Performed at [facility], [address] [place], [place] [address]    Report Status PENDING  Incomplete   Time coordinating discharge: 39 mins   SIGNED:  Standley Dakins, MD  [facility] 08/24/2021, 10:15 AM How to contact the [facility] Attending or Consulting provider 7A - 7P or covering provider during after hours 7P -7A, for this patient?  Check the care team in [facility] and look for a) attending/consulting [facility] provider listed and b) the [facility] team listed Log into [email or web address] and use [place]'s universal password to access. If you do not have the password, please contact the hospital operator. Locate the [facility] provider you are looking for under [facility] and page to a number that you can be directly reached. If you still have difficulty reaching the provider, please page the [facility] (Director on Call) for the Hospitalists listed on amion for assistance.

## 2021-08-24 NOTE — Progress Notes (Addendum)
Chart reviewed. LFTs continue to improve notably. INR normal. GI to sign off today. Outpatient follow-up is already scheduled for Oct 01, 2021 at 0930 with me. I will also arrange repeat HFP for later this week. We will need to follow to baseline.   Gelene Mink, PhD, ANP-BC [facility] Gastroenterology

## 2021-08-24 NOTE — Progress Notes (Signed)
Discharge instructions given pt verbalized understanding. IV x 2 removed. Waiting on sister to arrive for discharge ride.

## 2021-08-24 NOTE — Discharge Instructions (Signed)
PLEASE AVOID TYLENOL AND ATORVASTATIN FOR NEXT 1 WEEK    IMPORTANT INFORMATION: PAY CLOSE ATTENTION   PHYSICIAN DISCHARGE INSTRUCTIONS  Follow with Primary care provider  Jason Coop, FNP  and other consultants as instructed by your Hospitalist Physician  SEEK MEDICAL CARE OR RETURN TO EMERGENCY ROOM IF SYMPTOMS COME BACK, WORSEN OR NEW PROBLEM DEVELOPS   Please note: You were cared for by a hospitalist during your hospital stay. Every effort will be made to forward records to your primary care provider.  You can request that your primary care provider send for your hospital records if they have not received them.  Once you are discharged, your primary care physician will handle any further medical issues. Please note that NO REFILLS for any discharge medications will be authorized once you are discharged, as it is imperative that you return to your primary care physician (or establish a relationship with a primary care physician if you do not have one) for your post hospital discharge needs so that they can reassess your need for medications and monitor your lab values.  Please get a complete blood count and chemistry panel checked by your Primary MD at your next visit, and again as instructed by your Primary MD.  Get Medicines reviewed and adjusted: Please take all your medications with you for your next visit with your Primary MD  Laboratory/radiological data: Please request your Primary MD to go over all hospital tests and procedure/radiological results at the follow up, please ask your primary care provider to get all Hospital records sent to his/her office.  In some cases, they will be blood work, cultures and biopsy results pending at the time of your discharge. Please request that your primary care provider follow up on these results.  If you are diabetic, please bring your blood sugar readings with you to your follow up appointment with primary care.    Please call and  make your follow up appointments as soon as possible.    Also Note the following: If you experience worsening of your admission symptoms, develop shortness of breath, life threatening emergency, suicidal or homicidal thoughts you must seek medical attention immediately by calling 911 or calling your MD immediately  if symptoms less severe.  You must read complete instructions/literature along with all the possible adverse reactions/side effects for all the Medicines you take and that have been prescribed to you. Take any new Medicines after you have completely understood and accpet all the possible adverse reactions/side effects.   Do not drive when taking Pain medications or sleeping medications (Benzodiazepines)  Do not take more than prescribed Pain, Sleep and Anxiety Medications. It is not advisable to combine anxiety,sleep and pain medications without talking with your primary care practitioner  Special Instructions: If you have smoked or chewed Tobacco  in the last 2 yrs please stop smoking, stop any regular Alcohol  and or any Recreational drug use.  Wear Seat belts while driving.  Do not drive if taking any narcotic, mind altering or controlled substances or recreational drugs or alcohol.

## 2021-08-24 NOTE — Progress Notes (Signed)
  Transition of Care [facility]) Screening Note   Patient Details  Name: Dustin Monroe Date of Birth: March 08, 1946   Transition of Care [facility]) CM/SW Contact:    Annice Needy, LCSW Phone Number: 08/24/2021, 10:19 AM    Transition of Care Department [facility]) has reviewed patient and no TOC needs have been identified at this time. We will continue to monitor patient advancement through interdisciplinary progression rounds. If new patient transition needs arise, please place a TOC consult.

## 2021-08-24 NOTE — Therapy (Signed)
Spoke with nursing, no change in status since last PT eval on 12/10. Will discharge new PT orders. Refer to PT eval on 12/10.  8:56 AM, 08/24/21 Tereasa Coop, DPT Physical Therapy with [facility]  [phone] office

## 2021-08-24 NOTE — Telephone Encounter (Signed)
Dustin Monroe, patient will be discharged soon from hospital. History of elevated LFTs.   Please have him complete an HFP and INR on Thursday or Friday this week. Thanks!

## 2021-08-25 NOTE — Telephone Encounter (Signed)
Phoned and advised the pt's sister of pt needing to have labs done Friday @ [facility]. She states she will pick him up and take him because the phone number was her number and she states he doesn't live with her.

## 2021-08-27 LAB — CULTURE, BLOOD (SINGLE): Culture: NO GROWTH

## 2021-08-28 ENCOUNTER — Other Ambulatory Visit (HOSPITAL_COMMUNITY)
Admission: RE | Admit: 2021-08-28 | Discharge: 2021-08-28 | Disposition: A | Discharge status: Home / Self Care | Payer: Medicare Other | Source: Ambulatory Visit | Attending: Gastroenterology | Admitting: Gastroenterology

## 2021-08-28 DIAGNOSIS — N179 Acute kidney failure, unspecified: Secondary | ICD-10-CM | POA: Insufficient documentation

## 2021-08-28 DIAGNOSIS — R7989 Other specified abnormal findings of blood chemistry: Secondary | ICD-10-CM | POA: Insufficient documentation

## 2021-08-28 LAB — HEPATIC FUNCTION PANEL
ALT: 216 U/L — ABNORMAL HIGH (ref 0–44)
AST: 39 U/L (ref 15–41)
Albumin: 4.1 g/dL (ref 3.5–5.0)
Alkaline Phosphatase: 105 U/L (ref 38–126)
Bilirubin, Direct: 0.1 mg/dL (ref 0.0–0.2)
Indirect Bilirubin: 0.3 mg/dL (ref 0.3–0.9)
Total Bilirubin: 0.4 mg/dL (ref 0.3–1.2)
Total Protein: 6.8 g/dL (ref 6.5–8.1)

## 2021-08-28 LAB — PROTIME-INR
INR: 1 (ref 0.8–1.2)
Prothrombin Time: 12.7 seconds (ref 11.4–15.2)

## 2021-08-31 NOTE — Progress Notes (Signed)
CARDIOLOGY CONSULT NOTE       Patient ID: Dustin Monroe MRN: [id number] DOB/AGE: 10-16-1945 75 y.o.  Admit date: (Not on file) Referring Physician: Standley Dakins Primary Physician: Jason Coop, FNP Primary Cardiologist: New Reason for Consultation: Tachycardia  Active Problems:   * No active hospital problems. *   HPI:  75 y.o. referred by Dr Laural Benes for tachycardia post hospital d/c 08/24/21 History of HTN, HLD, DM , GERD, DJD Admitted with GI distress nausea vomiting and abdominal pain Had fever to 101.7 with elevated LFTls AST 3225, ALT 2504 CT abdomen no acute findings Hepatitis panel Negative to f/u with GI as outpatient ? 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 LFTls have normalized no longer tachycardic  Retired from [employer]   ROS All other systems reviewed and negative except as noted above  Past Medical History:  Diagnosis Date   Angina effort    DJD (degenerative joint disease)    DM (diabetes mellitus) (HCC)    GERD (gastroesophageal reflux disease)    HTN (hypertension)    Hyperlipemia     Family History  Problem Relation Age of Onset   Diabetes Mother    Hypertension Mother    Prostate cancer Father    Diabetes Father    Hypertension Father    Hypertension Sister    Hypertension Brother    Hypertension Brother    Hypertension Brother    Hypertension Sister     Social History   Socioeconomic History   Marital status: Single    Spouse  name: Not on file   Number of children: Not on file   Years of education: Not on file   Highest education level: Not on file  Occupational History   Not on file  Tobacco Use   Smoking status: Former    Years: 15.00    Types: Cigarettes   Smokeless tobacco: Never  Vaping Use   Vaping Use: Never used  Substance and Sexual Activity   Alcohol use: No   Drug use: No   Sexual activity: Not on file  Other Topics Concern   Not on file  Social History Narrative   Not on file   Social Determinants of Health   Financial Resource Strain: Not on file  Food Insecurity: Not on file  Transportation Needs: Not on file  Physical Activity: Not on file  Stress: Not on file  Social Connections: Not on file  Intimate Partner Violence: Not on file    Past Surgical History:  Procedure Laterality Date   KNEE ARTHROSCOPY Bilateral    PILONIDAL CYST EXCISION        Current Outpatient Medications:    amLODipine (NORVASC) 5 MG tablet, Take 1 tablet (5 mg total) by mouth daily., Disp: 90 tablet, Rfl: 3   aspirin EC 81 MG tablet, Take 81 mg by mouth daily., Disp: , Rfl:    atorvastatin (LIPITOR) 20 MG tablet, Take 1 tablet (20 mg total) by mouth daily at 6 PM.,  Disp: , Rfl:    esomeprazole (NEXIUM) 20 MG packet, Take 20 mg by mouth daily before breakfast., Disp: , Rfl:    HYDROcodone-acetaminophen (NORCO) 7.5-325 MG tablet, Take 1 tablet by mouth 3 (three) times daily., Disp: 30 tablet, Rfl: 0   losartan (COZAAR) 25 MG tablet, Take 1 tablet (25 mg total) by mouth daily., Disp: 30 tablet, Rfl: 1   metFORMIN (GLUCOPHAGE) 1000 MG tablet, Take 1,000 mg by mouth daily after supper., Disp: , Rfl:    metoprolol tartrate (LOPRESSOR) 25 MG tablet, Take 1 tablet by mouth twice daily (Patient taking differently: Pt takes once daily), Disp: 180 tablet, Rfl: 1    Physical Exam: Blood pressure 112/72, pulse 82, resp. rate 20, height 5\' 5"  (1.651 m), weight 181 lb (82.1 kg), SpO2 97 %.    Affect  appropriate Healthy:  appears stated age HEENT: normal Neck supple with no adenopathy JVP normal no bruits no thyromegaly Lungs clear with no wheezing and good diaphragmatic motion Heart:  S1/S2 no murmur, no rub, gallop or click PMI normal Abdomen: benighn, BS positve, no tenderness, no AAA no bruit.  No HSM or HJR Distal pulses intact with no bruits No edema Neuro non-focal Skin warm and dry No muscular weakness   Labs:   Lab Results  Component Value Date   WBC 5.8 08/24/2021   HGB 11.8 (L) 08/24/2021   HCT 35.5 (L) 08/24/2021   MCV 88.3 08/24/2021   PLT 200 08/24/2021    Recent Labs  Lab 09/09/21 0924  PROT 6.7  BILITOT 0.4  ALKPHOS 65  ALT 43  AST 25   No results found for: CKTOTAL, CKMB, CKMBINDEX, TROPONINI  Lab Results  Component Value Date   CHOL 102 08/22/2021   Lab Results  Component Value Date   HDL 25 (L) 08/22/2021   Lab Results  Component Value Date   LDLCALC 57 08/22/2021   Lab Results  Component Value Date   TRIG 101 08/22/2021   Lab Results  Component Value Date   CHOLHDL 4.1 08/22/2021   No results found for: LDLDIRECT    Radiology: CT ABDOMEN PELVIS W CONTRAST  Result Date: 08/22/2021 CLINICAL DATA:  Abdominal pain. EXAM: CT ABDOMEN AND PELVIS WITH CONTRAST TECHNIQUE: Multidetector CT imaging of the abdomen and pelvis was performed using the standard protocol following bolus administration of intravenous contrast. CONTRAST:  14/06/2021 OMNIPAQUE IOHEXOL 300 MG/ML  SOLN COMPARISON:  Abdominal ultrasound dated 12/18/2020. CT abdomen pelvis dated 12/08/2017. FINDINGS: Lower chest: The visualized lung bases are clear. There is coronary vascular calcification. No intra-abdominal free air or free fluid. Hepatobiliary: Subcentimeter hepatic hypodense focus is too small to characterize. No intrahepatic biliary dilatation. No calcified gallstone or pericholecystic fluid. Pancreas: Unremarkable. No pancreatic ductal dilatation or surrounding  inflammatory changes. Spleen: Normal in size without focal abnormality. Adrenals/Urinary Tract: The adrenal glands are unremarkable. The kidneys, visualized ureters, and urinary bladder appear unremarkable. Stomach/Bowel: There is no bowel obstruction or active inflammation. The appendix is normal. Vascular/Lymphatic: Moderate aortoiliac atherosclerotic disease. The IVC is unremarkable. No portal venous gas. There is no adenopathy. Reproductive: The prostate and seminal vesicles are grossly unremarkable. No pelvic mass. Other: None Musculoskeletal: Degenerative changes of the spine. No acute osseous pathology. IMPRESSION: 1. No acute intra-abdominal or pelvic pathology. 2. Aortic Atherosclerosis (ICD10-I70.0). Electronically Signed   By: 12/10/2017 M.D.   On: 08/22/2021 01:19    EKG: not released from 08/21/21 reported sinus tachycardia   ASSESSMENT AND PLAN:   Tachycardia :  historical diagnosis. Previously normal EF 2019 Check TSH and update echo continue beta blocker HR in normal range today no need for monitor  HTN:  Well controlled.  Continue current medications and low sodium Dash type diet.   Elevated LFTls :  f/u GI ? From Trazodone Abdominal CT negative avoid Tylenol Noted normalization of labs on 09/09/21   DM:  Discussed low carb diet.  Target hemoglobin A1c is 6.5 or less.  Continue current medications. HLD:  hold statin until LFTls normalize or definitive cause found   TSH/LFTls T4 Echo   F/U PRN   Signed: Charlton Haws 09/10/2021, 1:39 PM

## 2021-09-01 ENCOUNTER — Other Ambulatory Visit: Payer: Self-pay

## 2021-09-01 DIAGNOSIS — R7989 Other specified abnormal findings of blood chemistry: Secondary | ICD-10-CM

## 2021-09-01 DIAGNOSIS — B179 Acute viral hepatitis, unspecified: Secondary | ICD-10-CM

## 2021-09-09 ENCOUNTER — Encounter: Payer: Self-pay | Admitting: Internal Medicine

## 2021-09-09 ENCOUNTER — Other Ambulatory Visit (HOSPITAL_COMMUNITY)
Admission: RE | Admit: 2021-09-09 | Discharge: 2021-09-09 | Disposition: A | Discharge status: Home / Self Care | Payer: Medicare Other | Source: Ambulatory Visit | Attending: Gastroenterology | Admitting: Gastroenterology

## 2021-09-09 DIAGNOSIS — R7989 Other specified abnormal findings of blood chemistry: Secondary | ICD-10-CM | POA: Insufficient documentation

## 2021-09-09 DIAGNOSIS — B179 Acute viral hepatitis, unspecified: Secondary | ICD-10-CM | POA: Insufficient documentation

## 2021-09-09 LAB — HEPATIC FUNCTION PANEL
ALT: 43 U/L (ref 0–44)
AST: 25 U/L (ref 15–41)
Albumin: 4.3 g/dL (ref 3.5–5.0)
Alkaline Phosphatase: 65 U/L (ref 38–126)
Bilirubin, Direct: 0.1 mg/dL (ref 0.0–0.2)
Indirect Bilirubin: 0.3 mg/dL (ref 0.3–0.9)
Total Bilirubin: 0.4 mg/dL (ref 0.3–1.2)
Total Protein: 6.7 g/dL (ref 6.5–8.1)

## 2021-09-10 ENCOUNTER — Other Ambulatory Visit: Payer: Self-pay

## 2021-09-10 ENCOUNTER — Other Ambulatory Visit (HOSPITAL_COMMUNITY)
Admission: RE | Admit: 2021-09-10 | Discharge: 2021-09-10 | Disposition: A | Discharge status: Home / Self Care | Payer: Medicare Other | Source: Ambulatory Visit | Attending: Cardiovascular Disease | Admitting: Cardiovascular Disease

## 2021-09-10 ENCOUNTER — Encounter: Payer: Self-pay | Admitting: Cardiovascular Disease

## 2021-09-10 ENCOUNTER — Ambulatory Visit (INDEPENDENT_AMBULATORY_CARE_PROVIDER_SITE_OTHER): Payer: Medicare Other | Admitting: Cardiovascular Disease

## 2021-09-10 VITALS — BP 112/72 | HR 82 | Resp 20 | Ht 65.0 in | Wt 181.0 lb

## 2021-09-10 DIAGNOSIS — I10 Essential (primary) hypertension: Secondary | ICD-10-CM | POA: Diagnosis not present

## 2021-09-10 DIAGNOSIS — R7989 Other specified abnormal findings of blood chemistry: Secondary | ICD-10-CM | POA: Diagnosis not present

## 2021-09-10 DIAGNOSIS — R000 Tachycardia, unspecified: Secondary | ICD-10-CM | POA: Insufficient documentation

## 2021-09-10 DIAGNOSIS — R0602 Shortness of breath: Secondary | ICD-10-CM

## 2021-09-10 LAB — TSH: TSH: 1.811 u[IU]/mL (ref 0.350–4.500)

## 2021-09-10 NOTE — Patient Instructions (Signed)
Medication Instructions:  Your physician recommends that you continue on your current medications as directed. Please refer to the Current Medication list given to you today.   Labwork: TSH, T4  Testing/Procedures: Your physician has requested that you have an echocardiogram. Echocardiography is a painless test that uses sound waves to create images of your heart. It provides your doctor with information about the size and shape of your heart and how well your hearts chambers and valves are working. This procedure takes approximately one hour. There are no restrictions for this procedure.   Follow-Up: As needed  Any Other Special Instructions Will Be Listed Below (If Applicable).  If you need a refill on your cardiac medications before your next appointment, please call your pharmacy.

## 2021-09-11 LAB — T4: T4, Total: 7.1 ug/dL (ref 4.5–12.0)

## 2021-10-01 ENCOUNTER — Ambulatory Visit: Payer: Medicare Other | Admitting: Gastroenterology

## 2021-10-14 ENCOUNTER — Ambulatory Visit (HOSPITAL_COMMUNITY)
Admission: RE | Admit: 2021-10-14 | Discharge: 2021-10-14 | Disposition: A | Discharge status: Home / Self Care | Payer: Medicare Other | Source: Ambulatory Visit | Attending: Cardiovascular Disease | Admitting: Cardiovascular Disease

## 2021-10-14 ENCOUNTER — Other Ambulatory Visit: Payer: Self-pay

## 2021-10-14 DIAGNOSIS — R0602 Shortness of breath: Secondary | ICD-10-CM | POA: Diagnosis present

## 2021-10-14 LAB — ECHOCARDIOGRAM COMPLETE
AR max vel: 2.51 cm2
AV Area VTI: 2.67 cm2
AV Area mean vel: 2.61 cm2
AV Mean grad: 4.1 mmHg
AV Peak grad: 8.5 mmHg
Ao pk vel: 1.45 m/s
Area-P 1/2: 2.22 cm2
S' Lateral: 3.1 cm

## 2021-10-14 NOTE — Progress Notes (Signed)
*  PRELIMINARY RESULTS* Echocardiogram 2D Echocardiogram has been performed.  Dustin Monroe 10/14/2021, 4:07 PM

## 2021-11-24 NOTE — Progress Notes (Signed)
? ?Referring Provider:   Wannetta Sender, FNP ?Primary Care Physician:  Wannetta Sender, FNP ?Primary Gastroenterologist:  Dr. Abbey Chatters ? ?Chief Complaint  ?Patient presents with  ? Gastroesophageal Reflux  ?  Feels like something is getting stuck in throat   ? ? ?HPI:   ?Dustin Monroe is a 76 y.o. male presenting today for hospital follow-up of transaminitis.  Also reporting GERD and dysphagia. ? ?We saw patient during hospitalization in December 2022 after he presented with chief complaint of generalized abdominal pain with associated abdominal distention, 1 episode of nonbloody emesis.  He was febrile with significant transaminitis-AST 3225, ALT 2504, T. bili 202, alk phos 169, INR 1.1, albumin and platelets normal.  CT A/P relatively unremarkable aside from a few chronic findings, no liver abnormalities.  UDS positive for opiates but chronically on Norco.  Patient denied any known history of liver disease, illicit drug use, alcohol use, family history of liver disease, new medications, recent antibiotics, herbal supplements, exposure to viral hepatitis.  He met sepsis criteria on admission, and was started on IV antibiotics and IV fluids. Acute viral hepatitis panel was negative.Acetaminophen level less than 10.  He was treated supportively and LFTs were improving.  Ultimately, etiology was identified, but queried drug-induced hepatitis, shock liver, sepsis.  Recommended outpatient follow-up. ? ?Repeat HFP on 12/28 with LFTs completely normalized. ? ?Today:  ? ?Chest tightness after eating.  No chest tightness with activity.  States it depends on what he eats. Triggers include vinegar, onions, spicy foods. Associated acid reflux. History of reflux. Has been on Nexium for at least year which had previously kept his symptoms well controlled. No nausea or vomiting.  Also with solid food dysphagia. Items getting hung around the sternal notch. Started about 1 year ago. Occurs a couple times a  week. No regurgitation. No abdominal pain. No brbpr, melena, constipation, or diarrhea.  ? ?81 mg aspirin. No other NSAIDs.  ? ?No prior colonoscopy and doesn't want to have one. No FHx of colon cancer.  Wants to do cologuard.  ? ?Stopped trazadone during hospitalization. Was told this may have contributed to his elevated liver enzymes. ?No Tylenol aside from what is the Norco, OTC supplements, herbal teas, alcohol, or illicit drug use. ? ?Past Medical History:  ?Diagnosis Date  ? Angina effort   ? DJD (degenerative joint disease)   ? DM (diabetes mellitus) ([place])   ? GERD (gastroesophageal reflux disease)   ? HTN (hypertension)   ? Hyperlipemia   ? ? ?Past Surgical History:  ?Procedure Laterality Date  ? KNEE ARTHROSCOPY Bilateral   ? PILONIDAL CYST EXCISION    ? ? ?Current Outpatient Medications  ?Medication Sig Dispense Refill  ? amLODipine (NORVASC) 5 MG tablet Take 1 tablet (5 mg total) by mouth daily. 90 tablet 3  ? aspirin EC 81 MG tablet Take 81 mg by mouth daily.    ? atorvastatin (LIPITOR) 20 MG tablet Take 1 tablet (20 mg total) by mouth daily at 6 PM.    ? HYDROcodone-acetaminophen (NORCO) 7.5-325 MG tablet Take 1 tablet by mouth 3 (three) times daily. 30 tablet 0  ? losartan (COZAAR) 25 MG tablet Take 1 tablet (25 mg total) by mouth daily. 30 tablet 1  ? metFORMIN (GLUCOPHAGE) 1000 MG tablet Take 1,000 mg by mouth daily after supper.    ? metoprolol tartrate (LOPRESSOR) 25 MG tablet Take 1 tablet by mouth twice daily (Patient taking differently: Pt takes once daily) 180 tablet 1  ?  pantoprazole (PROTONIX) 40 MG tablet Take 1 tablet (40 mg total) by mouth daily. 90 tablet 3  ? ?No current facility-administered medications for this visit.  ? ? ?Allergies as of 11/26/2021  ? (No Known Allergies)  ? ? ?Family History  ?Problem Relation Age of Onset  ? Diabetes Mother   ? Hypertension Mother   ? Prostate cancer Father   ? Diabetes Father   ? Hypertension Father   ? Hypertension Sister   ? Hypertension Sister    ? Hypertension Brother   ? Hypertension Brother   ? Hypertension Brother   ? Colon cancer Neg Hx   ? ? ?Social History  ? ?Socioeconomic History  ? Marital status: Single  ?  Spouse name: Not on file  ? Number of children: Not on file  ? Years of education: Not on file  ? Highest education level: Not on file  ?Occupational History  ? Not on file  ?Tobacco Use  ? Smoking status: Former  ?  Years: 15.00  ?  Types: Cigarettes  ? Smokeless tobacco: Never  ?Vaping Use  ? Vaping Use: Never used  ?Substance and Sexual Activity  ? Alcohol use: No  ? Drug use: No  ? Sexual activity: Not on file  ?Other Topics Concern  ? Not on file  ?Social History Narrative  ? Not on file  ? ?Social Determinants of Health  ? ?Financial Resource Strain: Not on file  ?Food Insecurity: Not on file  ?Transportation Needs: Not on file  ?Physical Activity: Not on file  ?Stress: Not on file  ?Social Connections: Not on file  ?Intimate Partner Violence: Not on file  ? ? ?Review of Systems: ?Gen: Denies any fever, chills, cold or flulike symptoms, presyncope, syncope. ?CV: Denies chest pain, heart palpitations. ?Resp: Denies shortness of breath or cough. ?GI: See HPI ?GU : Denies urinary burning, urinary frequency, urinary hesitancy ?MS: Denies joint pain ?Derm: Denies rash ?Psych: See HPI ?Heme: See HPI ? ?Physical Exam: ?BP 133/80   Pulse 82   Temp (!) 97.4 ?F (36.3 ?C) (Temporal)   Ht 5' 5"  (1.651 m)   Wt 187 lb 12.8 oz (85.2 kg)   BMI 31.25 kg/m?  ?General:   Alert and oriented. Pleasant and cooperative. Well-nourished and well-developed.  ?Head:  Normocephalic and atraumatic. ?Eyes:  Without icterus, sclera clear and conjunctiva pink.  ?Ears:  Normal auditory acuity. ?Lungs:  Clear to auscultation bilaterally. No wheezes, rales, or rhonchi. No distress.  ?Heart:  S1, S2 present without murmurs appreciated.  ?Abdomen:  +BS, soft, non-tender and non-distended. No HSM noted. No guarding or rebound. No masses appreciated.  ?Rectal:  Deferred   ?Msk:  Symmetrical without gross deformities. Normal posture. ?Extremities:  Without edema. ?Neurologic:  Alert and  oriented x4;  grossly normal neurologically. ?Skin:  Intact without significant lesions or rashes. ?Psych: Normal mood and affect.  ? ? ? ?Assessment: ?76 year old male with history of HTN, HLD, diabetes, GERD, presenting today for hospital follow-up of transaminitis.  Also discussed GERD, dysphagia, and colon cancer screening. ? ?Transaminitis: ?Patient was admitted in December 2022 with significant transaminitis meeting sepsis criteria on admission-AST 3225, ALT 2504, T. bili 202, alk phos 169, INR 1.1, albumin and platelets normal.  CT A/P with no liver abnormalities.  Denied illicit drug use, alcohol use, family history of liver disease, new medications, recent antibiotics, herbal supplements, exposure to viral hepatitis.  Acute viral hepatitis panel was negative.  Acetaminophen level less than 10.  He was  treated supportively with improvement in LFTs.  Etiology was thought to be secondary to drug-induced hepatitis, shock, or sepsis.  Patient reports trazodone was stopped during his hospitalization.  Encouragingly, repeat HFP on 12/28 with LFTs completely normalized.  Clinically, he continues to do very well.  We will repeat HFP at this time to ensure they remain normal. ? ?GERD: ?Chronic.  Previously well controlled on Nexium 20 mg daily, but currently with uncontrolled symptoms with associated chest pressure postprandially as well as intermittent solid food dysphagia.  We will stop Nexium and start Protonix 40 mg daily.  ? ?Dysphagia: ?Intermittent solid food dysphagia possibly secondary to uncontrolled GERD, esophagitis, esophageal web, ring, or stricture.  Less likely malignancy.  He has never had an EGD.  Discussed proceeding with an EGD for further evaluation and therapeutic intervention versus treating GERD.  Patient prefers to focus on GERD management first (discussed above).  Requested  he let me know if he continues with significant symptoms in 6-8 weeks and would like to proceed with an EGD. ? ?Colon cancer screening: ?No prior colonoscopy and currently declining colonoscopy.  No significa

## 2021-11-26 ENCOUNTER — Encounter: Payer: Self-pay | Admitting: Gastroenterology

## 2021-11-26 ENCOUNTER — Ambulatory Visit (INDEPENDENT_AMBULATORY_CARE_PROVIDER_SITE_OTHER): Payer: Medicare Other | Admitting: Gastroenterology

## 2021-11-26 ENCOUNTER — Other Ambulatory Visit: Payer: Self-pay

## 2021-11-26 VITALS — BP 133/80 | HR 82 | Temp 97.4°F | Ht 65.0 in | Wt 187.8 lb

## 2021-11-26 DIAGNOSIS — K219 Gastro-esophageal reflux disease without esophagitis: Secondary | ICD-10-CM

## 2021-11-26 DIAGNOSIS — R1310 Dysphagia, unspecified: Secondary | ICD-10-CM | POA: Diagnosis not present

## 2021-11-26 DIAGNOSIS — Z1211 Encounter for screening for malignant neoplasm of colon: Secondary | ICD-10-CM | POA: Diagnosis not present

## 2021-11-26 DIAGNOSIS — R7989 Other specified abnormal findings of blood chemistry: Secondary | ICD-10-CM | POA: Diagnosis not present

## 2021-11-26 MED ORDER — PANTOPRAZOLE SODIUM 40 MG PO TBEC: 40.0000 mg | DELAYED_RELEASE_TABLET | Freq: Every day | ORAL | 3 refills | Status: DC

## 2021-11-26 NOTE — Patient Instructions (Addendum)
Please have blood work completed at [facility]. ? ?Please complete Cologuard for colon cancer screening. ? ?Stop Nexium and start Protonix 40 mg daily 30 minutes before breakfast. ? ?Follow a GERD diet:  ?Avoid fried, fatty, greasy, spicy, citrus foods. ?Avoid caffeine and carbonated beverages. ?Avoid chocolate. ?Try eating 4-6 small meals a day rather than 3 large meals. ?Do not eat within 3 hours of laying down. ?Prop head of bed up on wood or bricks to create a 6 inch incline. ? ?Your swallowing problems may be due to reflux, inflammation in your esophagus, or you may have developed a ring or stricture in your esophagus.  As requested, we will focus on treating your reflux first and hold off on an upper endoscopy. ? ?To help with your current trouble swallowing: ?Eat slowly, take small bites, chew thoroughly, drink plenty of liquids throughout meals. ?Avoid tough textures. ?Chopped meats finely. ? ?If you continue to have significant trouble with swallowing in 6-8 weeks, and would like to proceed with an upper endoscopy, please let me know. ? ?We will plan to see you back in the office in 3 months.  Do not hesitate to call if you have questions or concerns prior to your next visit. ? ?It was good meeting you today! ? ?Ermalinda Memos, PA-C ?[facility] ? ?

## 2021-11-27 LAB — HEPATIC FUNCTION PANEL
AG Ratio: 2.2 (calc) (ref 1.0–2.5)
ALT: 24 U/L (ref 9–46)
AST: 17 U/L (ref 10–35)
Albumin: 4.7 g/dL (ref 3.6–5.1)
Alkaline phosphatase (APISO): 59 U/L (ref 35–144)
Bilirubin, Direct: 0.1 mg/dL (ref 0.0–0.2)
Globulin: 2.1 g/dL (calc) (ref 1.9–3.7)
Indirect Bilirubin: 0.3 mg/dL (calc) (ref 0.2–1.2)
Total Bilirubin: 0.4 mg/dL (ref 0.2–1.2)
Total Protein: 6.8 g/dL (ref 6.1–8.1)

## 2021-12-31 ENCOUNTER — Ambulatory Visit: Payer: Medicare Other | Admitting: Gastroenterology

## 2022-01-08 ENCOUNTER — Telehealth: Payer: Self-pay | Admitting: Gastroenterology

## 2022-01-08 NOTE — Telephone Encounter (Signed)
Dustin Monroe, I ordered a Cologuard for this patient at his last office visit on 11/26/2021.  No results thus far. Can you call the patient to make sure he did receive the Cologuard test in the mail?  ?

## 2022-01-11 NOTE — Telephone Encounter (Signed)
Noted  

## 2022-01-11 NOTE — Telephone Encounter (Signed)
Spoke to pt's sister Asher Muir [facility]) asked about Cologuard and she informed me that he has the test, but has not completed it yet. Informed to have it completed as soon as possible.  ?

## 2022-01-13 LAB — COLOGUARD

## 2022-02-10 ENCOUNTER — Ambulatory Visit: Payer: Medicare Other | Admitting: Gastroenterology

## 2022-02-27 NOTE — Progress Notes (Deleted)
Referring Provider: Hector Brunswick* Primary Care Physician:  Wannetta Sender, FNP Primary GI Physician: Dr. Abbey Chatters  No chief complaint on file.   HPI:   Dustin Monroe is a 76 y.o. male presenting today for follow-up of GERD and dysphagia. History of significant transaminitis in December 2022 possibly secondary to drug induced hepatitis, shock, or sepsis with LFTs completely normalizing post hospitalization.   Last seen in our office in March 2023. GERD was uncontrolled on Nexium 20 mg daily which had previously worked well for him. Also with intermittent solid food dysphagia. No other significant GI symptoms. Nexium was increased to 40 mg daily. Offered EGD, but patient declined. Also offered first ever colonoscopy, but patient declined and requested Cologiard which was ordered. HFP was also updated.   HFP wnl.  Cologuard completed but stated samples couldn't be processed. Patient was to be contacted to initiate new sample collection. Requested he let us know if he didn't receive another kit. Repeat Cologuard has not been completed and we did not receive a call from the patient.  Today:   GERD:  Dysphagia:  Colon cancer screening:  Past Medical History:  Diagnosis Date   Angina effort    DJD (degenerative joint disease)    DM (diabetes mellitus) ([place])    GERD (gastroesophageal reflux disease)    HTN (hypertension)    Hyperlipemia     Past Surgical History:  Procedure Laterality Date   KNEE ARTHROSCOPY Bilateral    PILONIDAL CYST EXCISION      Current Outpatient Medications  Medication Sig Dispense Refill   amLODipine (NORVASC) 5 MG tablet Take 1 tablet (5 mg total) by mouth daily. 90 tablet 3   aspirin EC 81 MG tablet Take 81 mg by mouth daily.     atorvastatin (LIPITOR) 20 MG tablet Take 1 tablet (20 mg total) by mouth daily at 6 PM.     HYDROcodone-acetaminophen (NORCO) 7.5-325 MG tablet Take 1 tablet by mouth 3 (three) times daily. 30 tablet  0   losartan (COZAAR) 25 MG tablet Take 1 tablet (25 mg total) by mouth daily. 30 tablet 1   metFORMIN (GLUCOPHAGE) 1000 MG tablet Take 1,000 mg by mouth daily after supper.     metoprolol tartrate (LOPRESSOR) 25 MG tablet Take 1 tablet by mouth twice daily (Patient taking differently: Pt takes once daily) 180 tablet 1   pantoprazole (PROTONIX) 40 MG tablet Take 1 tablet (40 mg total) by mouth daily. 90 tablet 3   No current facility-administered medications for this visit.    Allergies as of 03/01/2022   (No Known Allergies)    Family History  Problem Relation Age of Onset   Diabetes Mother    Hypertension Mother    Prostate cancer Father    Diabetes Father    Hypertension Father    Hypertension Sister    Hypertension Sister    Hypertension Brother    Hypertension Brother    Hypertension Brother    Colon cancer Neg Hx     Social History   Socioeconomic History   Marital status: Single    Spouse name: Not on file   Number of children: Not on file   Years of education: Not on file   Highest education level: Not on file  Occupational History   Not on file  Tobacco Use   Smoking status: Former    Years: 15.00    Types: Cigarettes   Smokeless tobacco: Never  Vaping Use  Vaping Use: Never used  Substance and Sexual Activity   Alcohol use: No   Drug use: No   Sexual activity: Not on file  Other Topics Concern   Not on file  Social History Narrative   Not on file   Social Determinants of Health   Financial Resource Strain: Not on file  Food Insecurity: Not on file  Transportation Needs: Not on file  Physical Activity: Not on file  Stress: Not on file  Social Connections: Not on file    Review of Systems: Gen: Denies fever, chills, cold or flu like symptoms, pre-syncope, or syncope.   CV: Denies chest pain, palpitations. Resp: Denies dyspnea, cough.  GI:See HPI.  Heme: See HPI  Physical Exam: There were no vitals taken for this visit. General:   Alert  and oriented. No distress noted. Pleasant and cooperative.  Head:  Normocephalic and atraumatic. Eyes:  Conjuctiva clear without scleral icterus. Heart:  S1, S2 present without murmurs appreciated. Lungs:  Clear to auscultation bilaterally. No wheezes, rales, or rhonchi. No distress.  Abdomen:  +BS, soft, non-tender and non-distended. No rebound or guarding. No HSM or masses noted. Msk:  Symmetrical without gross deformities. Normal posture. Extremities:  Without edema. Neurologic:  Alert and  oriented x4 Psych:  Normal mood and affect.    Assessment:  76 year old male with history of GERD, significant transaminitis in December 2022 possibly secondary to drug-induced hepatitis, shock, sepsis with LFTs normalizing posthospitalization, presenting today for follow-up of GERD and dysphagia.   GERD:  Dysphagia:  Colon cancer screening:  Plan:  ***   Aliene Altes, PA-C [facility] 03/01/2022

## 2022-03-01 ENCOUNTER — Ambulatory Visit: Payer: Medicare Other | Admitting: Gastroenterology

## 2022-03-10 ENCOUNTER — Telehealth: Payer: Self-pay

## 2022-03-10 MED ORDER — METOPROLOL TARTRATE 25 MG PO TABS: 25.0000 mg | ORAL_TABLET | Freq: Two times a day (BID) | ORAL | 1 refills | Status: DC

## 2022-03-10 NOTE — Telephone Encounter (Signed)
Medication refill request for Metoprolol Tartrate 25 mg tablets approved and sent to [facility] pharmacy per pt's request.

## 2022-04-26 NOTE — H&P (View-Only) (Signed)
  Referring Provider: Nicholson, Sterling J I* Primary Care Physician:  Nicholson, Sterling J IV, FNP Primary GI Physician: Dr. Carver  No chief complaint on file.   HPI:   Dustin Monroe is a 76 y.o. male presenting today for follow-up of GERD and dysphagia. History GERD and of significant transaminitis in December 2022 possibly secondary to drug induced hepatitis, shock, or sepsis with LFTs completely normalizing post hospitalization.    Last seen in our office in March 2023. GERD was uncontrolled on Nexium 20 mg daily which had previously worked well for him. Also with intermittent solid food dysphagia. No other significant GI symptoms. Nexium was increased to 40 mg daily. Offered EGD, but patient declined. Also offered first ever colonoscopy, but patient declined and requested Cologiard which was ordered. HFP was also updated.   HFP wnl.   Cologuard completed but stated samples couldn't be processed. Patient was to be contacted to initiate new sample collection. Requested he let us know if he didn't receive another kit. Repeat Cologuard has not been completed and we did not receive a call from the patient.  Today:    GERD:   Dysphagia:   Colon cancer screening:  Past Medical History:  Diagnosis Date   Angina effort    DJD (degenerative joint disease)    DM (diabetes mellitus) ([facility])    GERD (gastroesophageal reflux disease)    HTN (hypertension)    Hyperlipemia     Past Surgical History:  Procedure Laterality Date   KNEE ARTHROSCOPY Bilateral    PILONIDAL CYST EXCISION      Current Outpatient Medications  Medication Sig Dispense Refill   amLODipine (NORVASC) 5 MG tablet Take 1 tablet (5 mg total) by mouth daily. 90 tablet 3   aspirin EC 81 MG tablet Take 81 mg by mouth daily.     atorvastatin (LIPITOR) 20 MG tablet Take 1 tablet (20 mg total) by mouth daily at 6 PM.     HYDROcodone-acetaminophen (NORCO) 7.5-325 MG tablet Take 1 tablet by mouth 3 (three) times  daily. 30 tablet 0   losartan (COZAAR) 25 MG tablet Take 1 tablet (25 mg total) by mouth daily. 30 tablet 1   metFORMIN (GLUCOPHAGE) 1000 MG tablet Take 1,000 mg by mouth daily after supper.     metoprolol tartrate (LOPRESSOR) 25 MG tablet Take 1 tablet (25 mg total) by mouth 2 (two) times daily. 180 tablet 1   pantoprazole (PROTONIX) 40 MG tablet Take 1 tablet (40 mg total) by mouth daily. 90 tablet 3   No current facility-administered medications for this visit.    Allergies as of 04/28/2022   (No Known Allergies)    Family History  Problem Relation Age of Onset   Diabetes Mother    Hypertension Mother    Prostate cancer Father    Diabetes Father    Hypertension Father    Hypertension Sister    Hypertension Sister    Hypertension Brother    Hypertension Brother    Hypertension Brother    Colon cancer Neg Hx     Social History   Socioeconomic History   Marital status: Single    Spouse name: Not on file   Number of children: Not on file   Years of education: Not on file   Highest education level: Not on file  Occupational History   Not on file  Tobacco Use   Smoking status: Former    Years: 15.00    Types: Cigarettes   Smokeless tobacco:   Never  Vaping Use   Vaping Use: Never used  Substance and Sexual Activity   Alcohol use: No   Drug use: No   Sexual activity: Not on file  Other Topics Concern   Not on file  Social History Narrative   Not on file   Social Determinants of Health   Financial Resource Strain: Not on file  Food Insecurity: Not on file  Transportation Needs: Not on file  Physical Activity: Not on file  Stress: Not on file  Social Connections: Not on file    Review of Systems: Gen: Denies fever, chills, cold or flulike symptoms, presyncope, syncope. CV: Denies chest pain, palpitations. Resp: Denies dyspnea at rest, cough. GI: See HPI Heme: See HPI  Physical Exam: There were no vitals taken for this visit. General:   Alert and  oriented. No distress noted. Pleasant and cooperative.  Head:  Normocephalic and atraumatic. Eyes:  Conjuctiva clear without scleral icterus. Heart:  S1, S2 present without murmurs appreciated. Lungs:  Clear to auscultation bilaterally. No wheezes, rales, or rhonchi. No distress.  Abdomen:  +BS, soft, non-tender and non-distended. No rebound or guarding. No HSM or masses noted. Msk:  Symmetrical without gross deformities. Normal posture. Extremities:  Without edema. Neurologic:  Alert and  oriented x4 Psych:  Normal mood and affect.    Assessment:     Plan:  ***   Artemisia Auvil, PA-C [facility] 04/28/2022  

## 2022-04-26 NOTE — Progress Notes (Unsigned)
Referring Provider: Dorene Grebe* Primary Care Physician:  Jason Coop, FNP Primary GI Physician: Dr. Marletta Lor  No chief complaint on file.   HPI:   Dustin Monroe is a 76 y.o. male presenting today for follow-up of GERD and dysphagia. History GERD and of significant transaminitis in December 2022 possibly secondary to drug induced hepatitis, shock, or sepsis with LFTs completely normalizing post hospitalization.    Last seen in our office in March 2023. GERD was uncontrolled on Nexium 20 mg daily which had previously worked well for him. Also with intermittent solid food dysphagia. No other significant GI symptoms. Nexium was increased to 40 mg daily. Offered EGD, but patient declined. Also offered first ever colonoscopy, but patient declined and requested Cologiard which was ordered. HFP was also updated.   HFP wnl.   Cologuard completed but stated samples couldn't be processed. Patient was to be contacted to initiate new sample collection. Requested he let us know if he didn't receive another kit. Repeat Cologuard has not been completed and we did not receive a call from the patient.  Today:    GERD:   Dysphagia:   Colon cancer screening:  Past Medical History:  Diagnosis Date   Angina effort    DJD (degenerative joint disease)    DM (diabetes mellitus) ([facility])    GERD (gastroesophageal reflux disease)    HTN (hypertension)    Hyperlipemia     Past Surgical History:  Procedure Laterality Date   KNEE ARTHROSCOPY Bilateral    PILONIDAL CYST EXCISION      Current Outpatient Medications  Medication Sig Dispense Refill   amLODipine (NORVASC) 5 MG tablet Take 1 tablet (5 mg total) by mouth daily. 90 tablet 3   aspirin EC 81 MG tablet Take 81 mg by mouth daily.     atorvastatin (LIPITOR) 20 MG tablet Take 1 tablet (20 mg total) by mouth daily at 6 PM.     HYDROcodone-acetaminophen (NORCO) 7.5-325 MG tablet Take 1 tablet by mouth 3 (three) times  daily. 30 tablet 0   losartan (COZAAR) 25 MG tablet Take 1 tablet (25 mg total) by mouth daily. 30 tablet 1   metFORMIN (GLUCOPHAGE) 1000 MG tablet Take 1,000 mg by mouth daily after supper.     metoprolol tartrate (LOPRESSOR) 25 MG tablet Take 1 tablet (25 mg total) by mouth 2 (two) times daily. 180 tablet 1   pantoprazole (PROTONIX) 40 MG tablet Take 1 tablet (40 mg total) by mouth daily. 90 tablet 3   No current facility-administered medications for this visit.    Allergies as of 04/28/2022   (No Known Allergies)    Family History  Problem Relation Age of Onset   Diabetes Mother    Hypertension Mother    Prostate cancer Father    Diabetes Father    Hypertension Father    Hypertension Sister    Hypertension Sister    Hypertension Brother    Hypertension Brother    Hypertension Brother    Colon cancer Neg Hx     Social History   Socioeconomic History   Marital status: Single    Spouse name: Not on file   Number of children: Not on file   Years of education: Not on file   Highest education level: Not on file  Occupational History   Not on file  Tobacco Use   Smoking status: Former    Years: 15.00    Types: Cigarettes   Smokeless tobacco:  Never  Vaping Use   Vaping Use: Never used  Substance and Sexual Activity   Alcohol use: No   Drug use: No   Sexual activity: Not on file  Other Topics Concern   Not on file  Social History Narrative   Not on file   Social Determinants of Health   Financial Resource Strain: Not on file  Food Insecurity: Not on file  Transportation Needs: Not on file  Physical Activity: Not on file  Stress: Not on file  Social Connections: Not on file    Review of Systems: Gen: Denies fever, chills, cold or flulike symptoms, presyncope, syncope. CV: Denies chest pain, palpitations. Resp: Denies dyspnea at rest, cough. GI: See HPI Heme: See HPI  Physical Exam: There were no vitals taken for this visit. General:   Alert and  oriented. No distress noted. Pleasant and cooperative.  Head:  Normocephalic and atraumatic. Eyes:  Conjuctiva clear without scleral icterus. Heart:  S1, S2 present without murmurs appreciated. Lungs:  Clear to auscultation bilaterally. No wheezes, rales, or rhonchi. No distress.  Abdomen:  +BS, soft, non-tender and non-distended. No rebound or guarding. No HSM or masses noted. Msk:  Symmetrical without gross deformities. Normal posture. Extremities:  Without edema. Neurologic:  Alert and  oriented x4 Psych:  Normal mood and affect.    Assessment:     Plan:  ***   Ermalinda Memos, PA-C [facility] 04/28/2022

## 2022-04-28 ENCOUNTER — Encounter: Payer: Self-pay | Admitting: Gastroenterology

## 2022-04-28 ENCOUNTER — Ambulatory Visit (INDEPENDENT_AMBULATORY_CARE_PROVIDER_SITE_OTHER): Payer: Medicare Other | Admitting: Gastroenterology

## 2022-04-28 ENCOUNTER — Encounter: Payer: Self-pay | Admitting: *Deleted

## 2022-04-28 VITALS — BP 151/85 | HR 93 | Temp 97.9°F | Ht 65.0 in | Wt 200.8 lb

## 2022-04-28 DIAGNOSIS — K219 Gastro-esophageal reflux disease without esophagitis: Secondary | ICD-10-CM

## 2022-04-28 DIAGNOSIS — Z1211 Encounter for screening for malignant neoplasm of colon: Secondary | ICD-10-CM | POA: Diagnosis not present

## 2022-04-28 DIAGNOSIS — R1310 Dysphagia, unspecified: Secondary | ICD-10-CM | POA: Diagnosis not present

## 2022-04-28 MED ORDER — PANTOPRAZOLE SODIUM 40 MG PO TBEC: 40.0000 mg | DELAYED_RELEASE_TABLET | Freq: Every day | ORAL | 3 refills | Status: DC

## 2022-04-28 NOTE — Patient Instructions (Signed)
We will arrange for you to have an upper endoscopy with possible stretching of your esophagus in the near future with Dr. Marletta Lor.  Swallowing precautions:  Eat slowly, take small bites, chew thoroughly, drink plenty of liquids throughout meals.  Avoid trough textures All meats should be chopped finely.  If something gets hung in your esophagus and will not come up or go down, proceed to the emergency room.    Continue pantoprazole 40 mg daily 30 minutes before breakfast to control your reflux.  Please complete your Cologuard test for colon cancer screening.  We will follow-up with you after your upper endoscopy.  Do not hesitate to call if you have questions or concerns prior to your next visit.  It was good to see you again today!  Ermalinda Memos, PA-C [facility]

## 2022-05-04 ENCOUNTER — Other Ambulatory Visit: Payer: Self-pay

## 2022-05-04 ENCOUNTER — Ambulatory Visit (HOSPITAL_COMMUNITY)
Admission: RE | Admit: 2022-05-04 | Discharge: 2022-05-04 | Disposition: A | Discharge status: Home / Self Care | Payer: Medicare Other | Attending: Internal Medicine | Admitting: Internal Medicine

## 2022-05-04 ENCOUNTER — Encounter (HOSPITAL_COMMUNITY)
Admission: RE | Disposition: A | Discharge status: Home / Self Care | Payer: Self-pay | Source: Home / Self Care | Attending: Internal Medicine

## 2022-05-04 ENCOUNTER — Ambulatory Visit (HOSPITAL_BASED_OUTPATIENT_CLINIC_OR_DEPARTMENT_OTHER): Payer: Medicare Other | Admitting: Anesthesiology

## 2022-05-04 ENCOUNTER — Encounter (HOSPITAL_COMMUNITY): Payer: Self-pay

## 2022-05-04 ENCOUNTER — Ambulatory Visit (HOSPITAL_COMMUNITY): Payer: Medicare Other | Admitting: Anesthesiology

## 2022-05-04 DIAGNOSIS — K2970 Gastritis, unspecified, without bleeding: Secondary | ICD-10-CM

## 2022-05-04 DIAGNOSIS — R1310 Dysphagia, unspecified: Secondary | ICD-10-CM

## 2022-05-04 DIAGNOSIS — I10 Essential (primary) hypertension: Secondary | ICD-10-CM | POA: Insufficient documentation

## 2022-05-04 DIAGNOSIS — K219 Gastro-esophageal reflux disease without esophagitis: Secondary | ICD-10-CM | POA: Insufficient documentation

## 2022-05-04 DIAGNOSIS — K222 Esophageal obstruction: Secondary | ICD-10-CM

## 2022-05-04 DIAGNOSIS — Z87891 Personal history of nicotine dependence: Secondary | ICD-10-CM | POA: Diagnosis not present

## 2022-05-04 DIAGNOSIS — Z7984 Long term (current) use of oral hypoglycemic drugs: Secondary | ICD-10-CM | POA: Diagnosis not present

## 2022-05-04 DIAGNOSIS — Z79899 Other long term (current) drug therapy: Secondary | ICD-10-CM | POA: Diagnosis not present

## 2022-05-04 DIAGNOSIS — K2289 Other specified disease of esophagus: Secondary | ICD-10-CM | POA: Diagnosis not present

## 2022-05-04 DIAGNOSIS — E119 Type 2 diabetes mellitus without complications: Secondary | ICD-10-CM | POA: Diagnosis not present

## 2022-05-04 DIAGNOSIS — K449 Diaphragmatic hernia without obstruction or gangrene: Secondary | ICD-10-CM | POA: Diagnosis not present

## 2022-05-04 HISTORY — PX: BALLOON DILATION: SHX5330

## 2022-05-04 HISTORY — PX: BIOPSY: SHX5522

## 2022-05-04 HISTORY — PX: ESOPHAGOGASTRODUODENOSCOPY (EGD) WITH PROPOFOL: SHX5813

## 2022-05-04 LAB — GLUCOSE, CAPILLARY: Glucose-Capillary: 165 mg/dL — ABNORMAL HIGH (ref 70–99)

## 2022-05-04 SURGERY — ESOPHAGOGASTRODUODENOSCOPY (EGD) WITH PROPOFOL
Anesthesia: General

## 2022-05-04 MED ORDER — PROPOFOL 10 MG/ML IV BOLUS
INTRAVENOUS | Status: DC | PRN
  Administered 2022-05-04: 20 mg via INTRAVENOUS
  Administered 2022-05-04: 30 mg via INTRAVENOUS
  Administered 2022-05-04: 100 mg via INTRAVENOUS
  Administered 2022-05-04: 30 mg via INTRAVENOUS

## 2022-05-04 MED ORDER — LIDOCAINE HCL (CARDIAC) PF 100 MG/5ML IV SOSY
PREFILLED_SYRINGE | INTRAVENOUS | Status: DC | PRN
  Administered 2022-05-04: 50 mg via INTRAVENOUS

## 2022-05-04 MED ORDER — LACTATED RINGERS IV SOLN: INTRAVENOUS | Status: DC | PRN

## 2022-05-04 MED ORDER — LACTATED RINGERS IV SOLN: INTRAVENOUS | Status: DC

## 2022-05-04 NOTE — Interval H&P Note (Signed)
History and Physical Interval Note:  05/04/2022 8:50 AM  Dustin Monroe  has presented today for surgery, with the diagnosis of DYSPHAGIA, GERD.  The various methods of treatment have been discussed with the patient and family. After consideration of risks, benefits and other options for treatment, the patient has consented to  Procedure(s) with comments: ESOPHAGOGASTRODUODENOSCOPY (EGD) WITH PROPOFOL (N/A) - 10:00 AM BALLOON DILATION (N/A) as a surgical intervention.  The patient's history has been reviewed, patient examined, no change in status, stable for surgery.  I have reviewed the patient's chart and labs.  Questions were answered to the patient's satisfaction.     Lanelle Bal

## 2022-05-04 NOTE — Anesthesia Preprocedure Evaluation (Signed)
Anesthesia Evaluation  Patient identified by MRN, date of birth, ID band Patient awake    Reviewed: Allergy & Precautions, H&P , NPO status , Patient's Chart, lab work & pertinent test results, reviewed documented beta blocker date and time   Airway Mallampati: II  TM Distance: >3 FB Neck ROM: full    Dental no notable dental hx.    Pulmonary neg pulmonary ROS, former smoker,    Pulmonary exam normal breath sounds clear to auscultation       Cardiovascular Exercise Tolerance: Good hypertension, negative cardio ROS   Rhythm:regular Rate:Normal     Neuro/Psych negative neurological ROS  negative psych ROS   GI/Hepatic Neg liver ROS, GERD  ,  Endo/Other  negative endocrine ROSdiabetes  Renal/GU negative Renal ROS  negative genitourinary   Musculoskeletal   Abdominal   Peds  Hematology negative hematology ROS (+)   Anesthesia Other Findings   Reproductive/Obstetrics negative OB ROS                             Anesthesia Physical Anesthesia Plan  ASA: 2  Anesthesia Plan: General   Post-op Pain Management:    Induction:   PONV Risk Score and Plan: Propofol infusion  Airway Management Planned:   Additional Equipment:   Intra-op Plan:   Post-operative Plan:   Informed Consent: I have reviewed the patients History and Physical, chart, labs and discussed the procedure including the risks, benefits and alternatives for the proposed anesthesia with the patient or authorized representative who has indicated his/her understanding and acceptance.     Dental Advisory Given  Plan Discussed with: CRNA  Anesthesia Plan Comments:         Anesthesia Quick Evaluation

## 2022-05-04 NOTE — Op Note (Signed)
[facility] Patient Name: Dustin Monroe Procedure Date: 05/04/2022 9:10 AM MRN: [id number] Date of Birth: 1946-08-17 Attending MD: Elon Alas. Edgar Frisk CSN: [id number] Age: 76 Admit Type: Outpatient Procedure:                Upper GI endoscopy Indications:              Dysphagia, Heartburn Providers:                Elon Alas. Abbey Chatters, DO, Charlsie Quest. Theda Sers RN, RN,                            Ladoris Gene Technician, Technician, Raphael Gibney,                            Technician Referring MD:              Medicines:                See the Anesthesia note for documentation of the                            administered medications Complications:            No immediate complications. Estimated Blood Loss:     Estimated blood loss was minimal. Procedure:                Pre-Anesthesia Assessment:                           - The anesthesia plan was to use monitored                            anesthesia care (MAC).                           After obtaining informed consent, the endoscope was                            passed under direct vision. Throughout the                            procedure, the patient's blood pressure, pulse, and                            oxygen saturations were monitored continuously. The                            GIF-H190 ([id number]) scope was introduced through the                            mouth, and advanced to the second part of duodenum.                            The upper GI endoscopy was accomplished without                            difficulty. The patient tolerated the procedure  well. Scope In: 9:19:14 AM Scope Out: 9:25:43 AM Total Procedure Duration: 0 hours 6 minutes 29 seconds  Findings:      A small hiatal hernia was present.      A mild Schatzki ring was found in the distal esophagus. A TTS dilator       was passed through the scope. Dilation with an 18-19-20 mm balloon       dilator was performed to 20 mm.  The dilation site was examined and       showed moderate improvement in luminal narrowing.      There were esophageal mucosal changes consistent with short-segment       Barrett's esophagus present at the gastroesophageal junction. The       maximum longitudinal extent of these mucosal changes was 1 cm in length.       Biopsies were taken with a cold forceps for histology.      Patchy mild inflammation characterized by erythema was found in the       gastric body. Biopsies were taken with a cold forceps for Helicobacter       pylori testing.      The duodenal bulb, first portion of the duodenum and second portion of       the duodenum were normal. Impression:               - Small hiatal hernia.                           - Mild Schatzki ring. Dilated.                           - Esophageal mucosal changes consistent with                            short-segment Barrett's esophagus. Biopsied.                           - Gastritis. Biopsied.                           - Normal duodenal bulb, first portion of the                            duodenum and second portion of the duodenum. Moderate Sedation:      Per Anesthesia Care Recommendation:           - Patient has a contact number available for                            emergencies. The signs and symptoms of potential                            delayed complications were discussed with the                            patient. Return to normal activities tomorrow.                            Written discharge instructions were provided to the  patient.                           - Resume previous diet.                           - Continue present medications.                           - Await pathology results.                           - Repeat upper endoscopy PRN for retreatment.                           - Return to GI clinic in 6 months.                           - Use a proton pump inhibitor PO  daily. Procedure Code(s):        --- Professional ---                           [phone], Esophagogastroduodenoscopy, flexible,                            transoral; with transendoscopic balloon dilation of                            esophagus (less than 30 mm diameter)                           [id number], 59, Esophagogastroduodenoscopy, flexible,                            transoral; with biopsy, single or multiple Diagnosis Code(s):        --- Professional ---                           K44.9, Diaphragmatic hernia without obstruction or                            gangrene                           K22.2, Esophageal obstruction                           K22.8, Other specified diseases of esophagus                           K29.70, Gastritis, unspecified, without bleeding                           R13.10, Dysphagia, unspecified                           R12, Heartburn CPT copyright 2019 American Medical Association. All rights reserved. The codes documented in this report are  preliminary and upon coder review may  be revised to meet current compliance requirements. Elon Alas. Abbey Chatters, DO [place] Abbey Chatters, DO 05/04/2022 9:29:49 AM This report has been signed electronically. Number of Addenda: 0

## 2022-05-04 NOTE — Transfer of Care (Signed)
Immediate Anesthesia Transfer of Care Note  Patient: Dustin Monroe  Procedure(s) Performed: ESOPHAGOGASTRODUODENOSCOPY (EGD) WITH PROPOFOL BALLOON DILATION BIOPSY  Patient Location: Endoscopy Unit  Anesthesia Type:General  Level of Consciousness: drowsy  Airway & Oxygen Therapy: Patient Spontanous Breathing  Post-op Assessment: Report given to RN and Post -op Vital signs reviewed and stable  Post vital signs: Reviewed and stable  Last Vitals:  Vitals Value Taken Time  BP 110/64 05/04/22 0928  Temp 36.5 C 05/04/22 0928  Pulse 102   Resp 14 05/04/22 0928  SpO2 93 % 05/04/22 0928    Last Pain:  Vitals:   05/04/22 0928  TempSrc: Oral  PainSc: 0-No pain      Patients Stated Pain Goal: 7 (05/04/22 0835)  Complications: No notable events documented.

## 2022-05-04 NOTE — Discharge Instructions (Addendum)
EGD Discharge instructions Please read the instructions outlined below and refer to this sheet in the next few weeks. These discharge instructions provide you with general information on caring for yourself after you leave the hospital. Your doctor may also give you specific instructions. While your treatment has been planned according to the most current medical practices available, unavoidable complications occasionally occur. If you have any problems or questions after discharge, please call your doctor. ACTIVITY You may resume your regular activity but move at a slower pace for the next 24 hours.  Take frequent rest periods for the next 24 hours.  Walking will help expel (get rid of) the air and reduce the bloated feeling in your abdomen.  No driving for 24 hours (because of the anesthesia (medicine) used during the test).  You may shower.  Do not sign any important legal documents or operate any machinery for 24 hours (because of the anesthesia used during the test).  NUTRITION Drink plenty of fluids.  You may resume your normal diet.  Begin with a light meal and progress to your normal diet.  Avoid alcoholic beverages for 24 hours or as instructed by your caregiver.  MEDICATIONS You may resume your normal medications unless your caregiver tells you otherwise.  WHAT YOU CAN EXPECT TODAY You may experience abdominal discomfort such as a feeling of fullness or "gas" pains.  FOLLOW-UP Your doctor will discuss the results of your test with you.  SEEK IMMEDIATE MEDICAL ATTENTION IF ANY OF THE FOLLOWING OCCUR: Excessive nausea (feeling sick to your stomach) and/or vomiting.  Severe abdominal pain and distention (swelling).  Trouble swallowing.  Temperature over 101 F (37.8 C).  Rectal bleeding or vomiting of blood.   Your EGD revealed mild amount inflammation in your stomach.  I took biopsies of this to rule out infection with a bacteria called H. pylori.  Await pathology results, my  office will contact you.  I also took samples of your esophagus to rule out a condition called Barrett's esophagus.  You did have a slight narrowing of your esophagus so I stretched it today.  Hopefully this helps with your swallowing.  Continue on pantoprazole daily.  Follow-up with GI in 6 months.    OFFICE WILL CONTACT PATIENT WITH FOLLOW UP APPOINTMENT  I hope you have a great rest of your week!  Hennie Duos. Marletta Lor, D.O. Gastroenterology and Hepatology [facility]

## 2022-05-05 LAB — SURGICAL PATHOLOGY

## 2022-05-05 NOTE — Anesthesia Postprocedure Evaluation (Signed)
Anesthesia Post Note  Patient: Dustin Monroe  Procedure(s) Performed: ESOPHAGOGASTRODUODENOSCOPY (EGD) WITH PROPOFOL BALLOON DILATION BIOPSY  Patient location during evaluation: Phase II Anesthesia Type: General Level of consciousness: awake Pain management: pain level controlled Vital Signs Assessment: post-procedure vital signs reviewed and stable Respiratory status: spontaneous breathing and respiratory function stable Cardiovascular status: blood pressure returned to baseline and stable Postop Assessment: no headache and no apparent nausea or vomiting Anesthetic complications: no Comments: Late entry   No notable events documented.   Last Vitals:  Vitals:   05/04/22 0835 05/04/22 0928  BP: (!) 164/76 110/64  Pulse: 97 (!) 102  Resp: (!) 21 14  Temp: 36.5 C 36.5 C  SpO2: 99% 93%    Last Pain:  Vitals:   05/04/22 0928  TempSrc: Oral  PainSc: 0-No pain                 Windell Norfolk

## 2022-05-10 ENCOUNTER — Encounter (HOSPITAL_COMMUNITY): Payer: Self-pay | Admitting: Internal Medicine

## 2022-05-18 ENCOUNTER — Telehealth: Payer: Self-pay | Admitting: *Deleted

## 2022-05-18 NOTE — Telephone Encounter (Signed)
Pt was approved for Pantoprazole 40 mg

## 2022-06-07 IMAGING — DX DG SACRUM/COCCYX 2+V
3 series · 3 of 3 positions shown · non-contrast
Comparison: None.

CLINICAL DATA: Fall, sacrococcygeal pain

EXAM:
SACRUM AND COCCYX - 2+ VIEW

[coccyx ap]
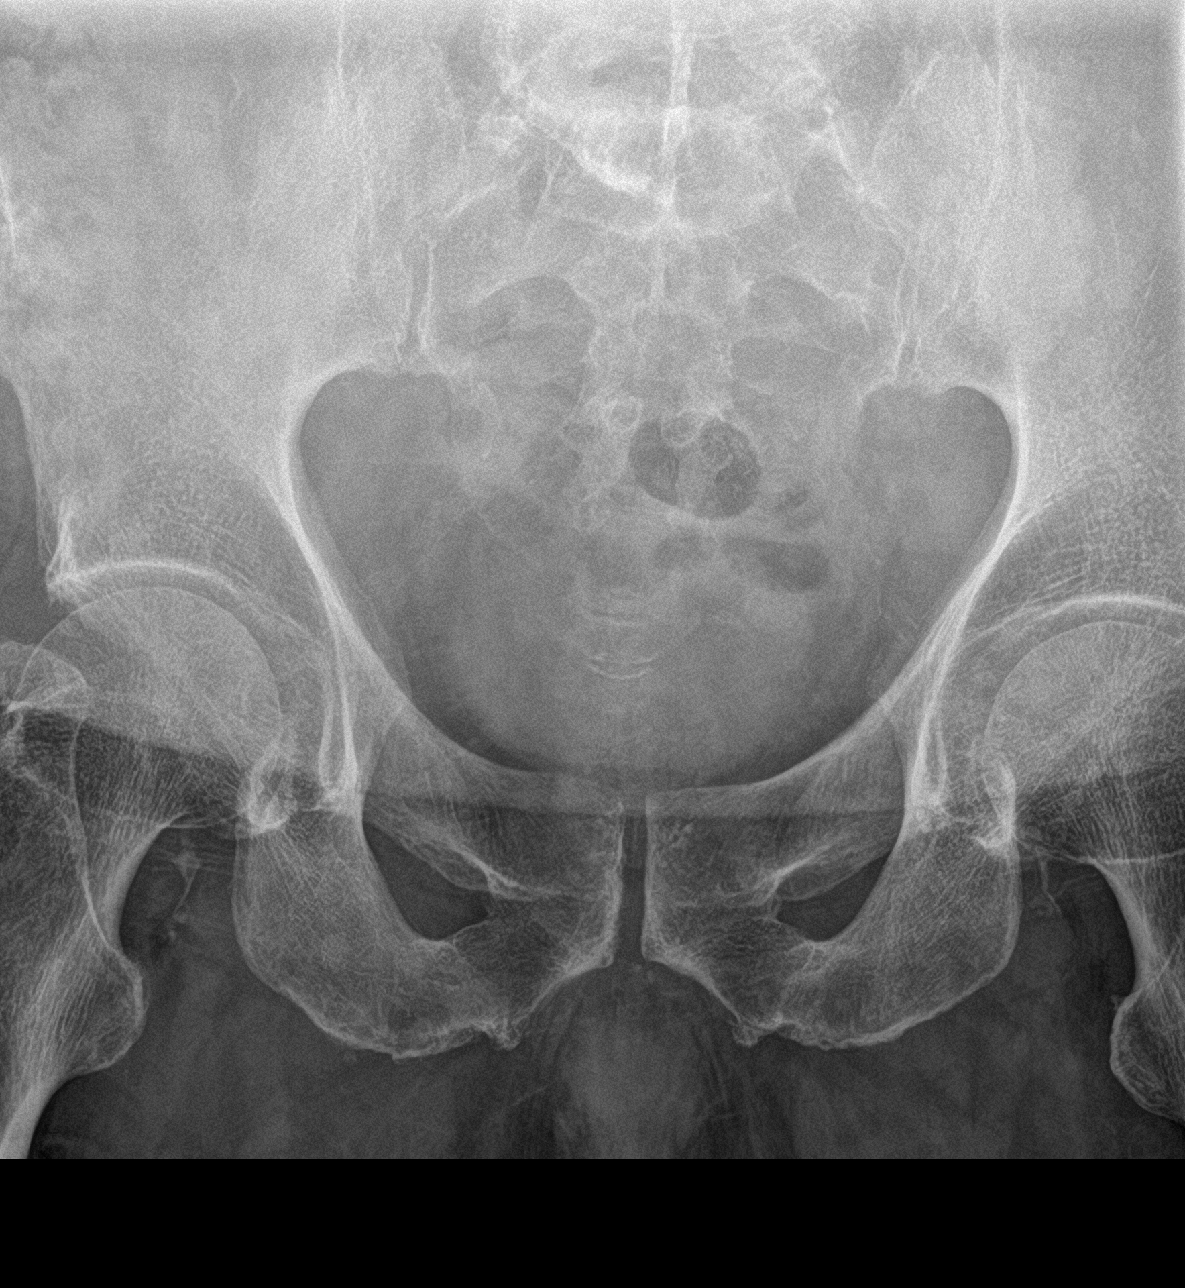

[sacrum ap]
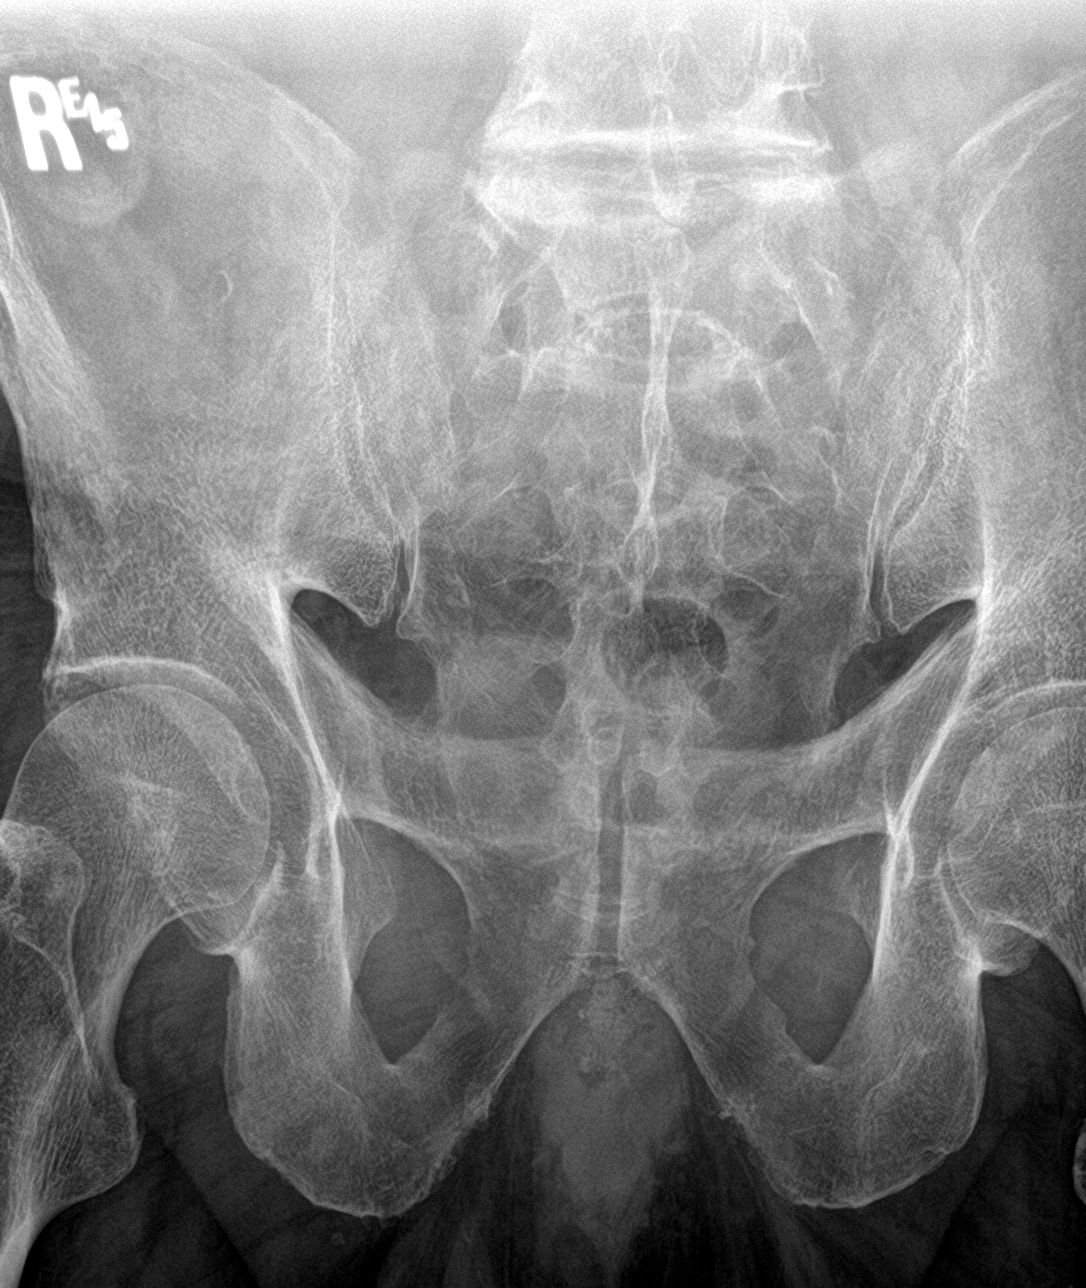

[sacrum lat]
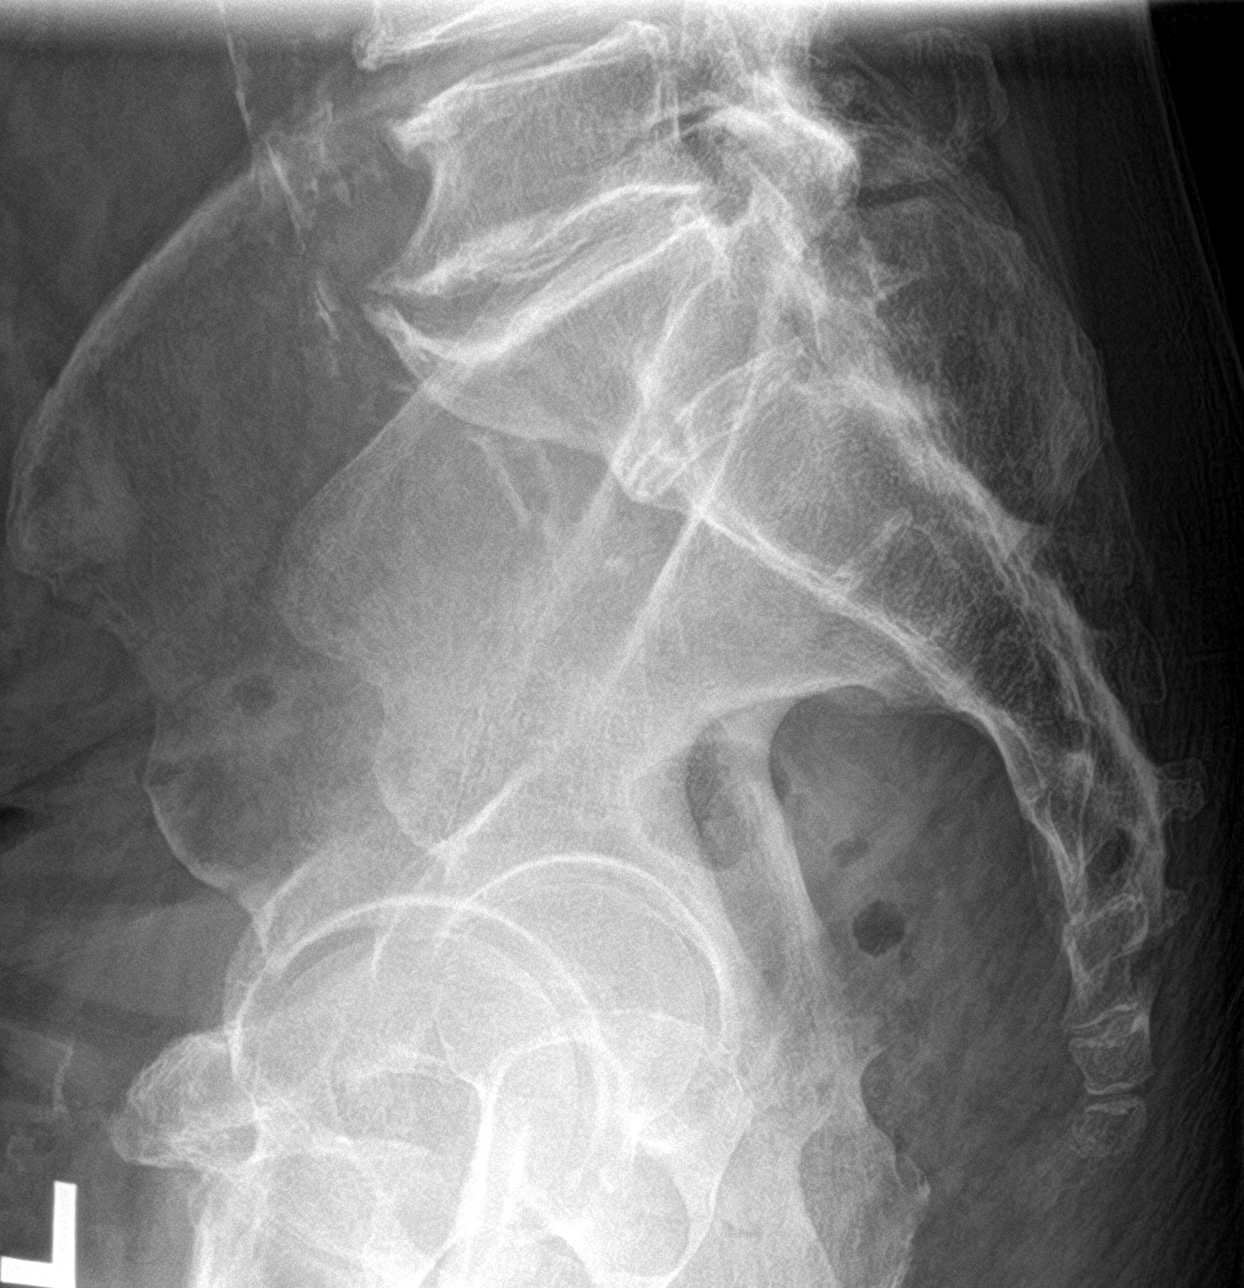

[3 of 3 positions shown; findings below may reference images not displayed]

FINDINGS: There is no evidence of fracture or other focal bone lesions.
Degenerative changes noted at the lumbosacral junction. Aortoiliac
vascular calcifications noted.
IMPRESSION: No acute fracture or dislocation.

## 2022-08-10 IMAGING — CT CT ABD-PELV W/ CM
2 of 5 series · 17 of 46 positions shown, 19 images · IV contrast (Omnipaque or Isovue)
Comparison: Abdominal ultrasound dated 12/18/2020. CT abdomen
pelvis dated 12/08/2017.

CLINICAL DATA: Abdominal pain.

EXAM:
CT ABDOMEN AND PELVIS WITH CONTRAST
TECHNIQUE: Multidetector CT imaging of the abdomen and pelvis was performed
using the standard protocol following bolus administration of
intravenous contrast.
CONTRAST:  100mL OMNIPAQUE IOHEXOL 300 MG/ML  SOLN

[Series 2: axial st · axial · 0.83mm/px · z∈[+647,+1097]mm · 14 of 102 slices shown, 16 images]
[im 6/102  soft-tissue]
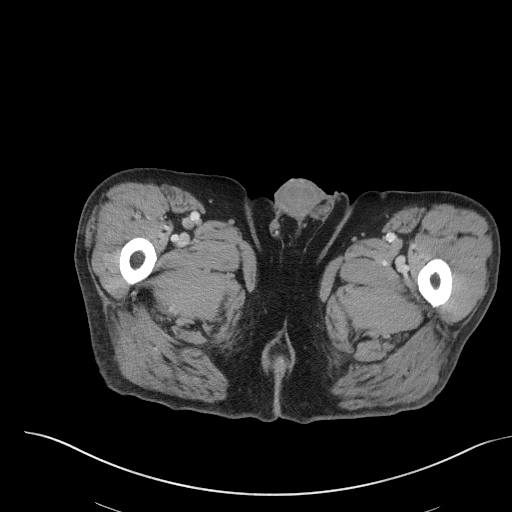
[im 6/102  bone]
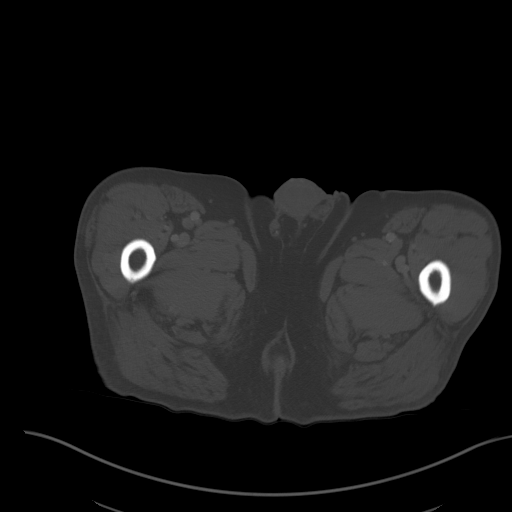
[im 12/102  soft-tissue]
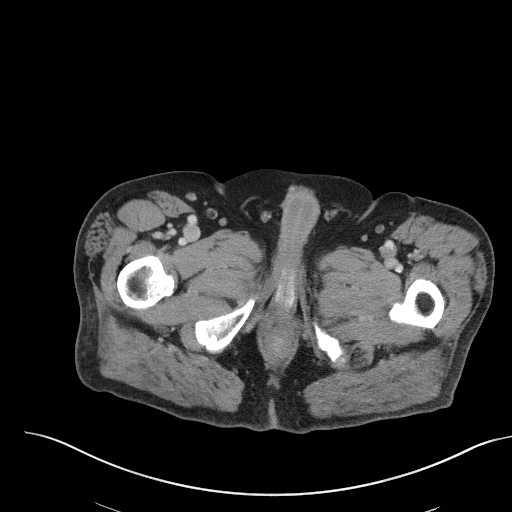
[im 23/102  soft-tissue]
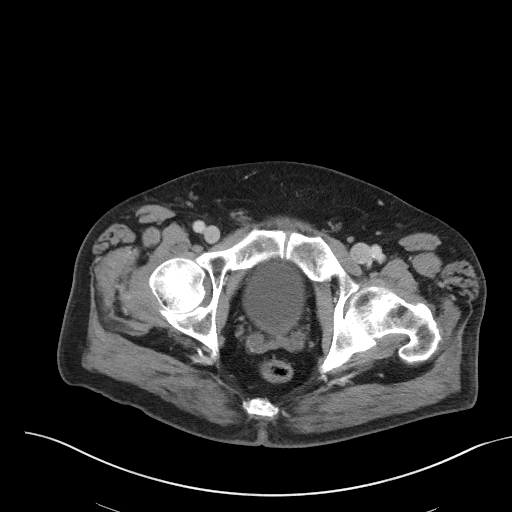
[im 29/102  soft-tissue]
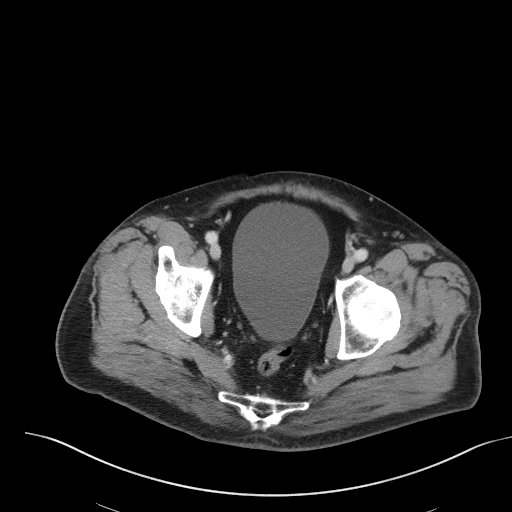
[im 34/102  soft-tissue]
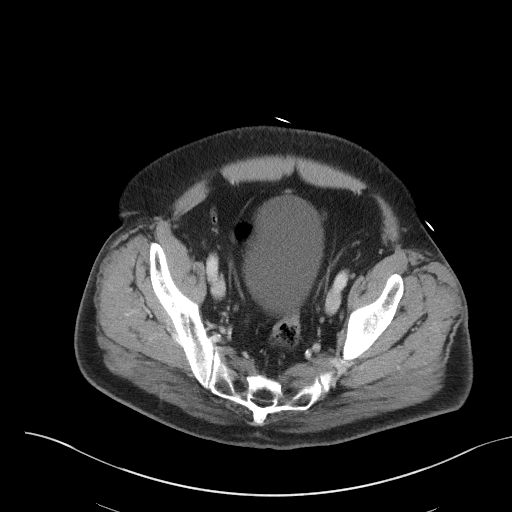
[im 40/102  soft-tissue]
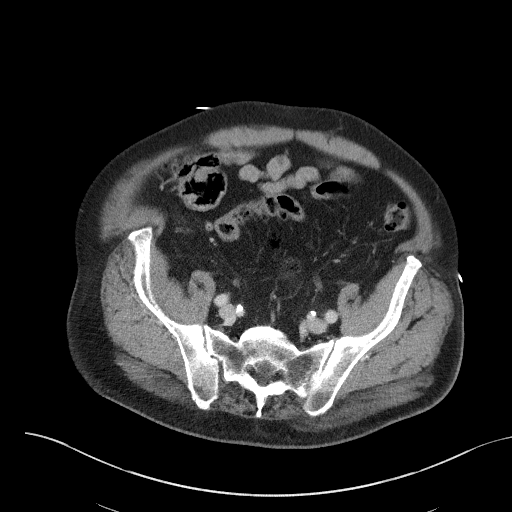
[im 45/102  soft-tissue]
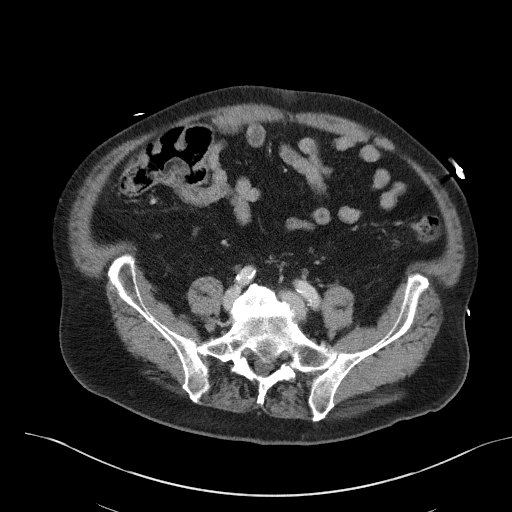
[im 57/102  soft-tissue]
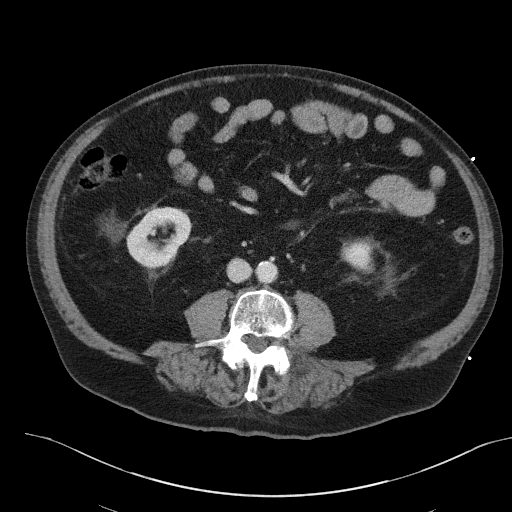
[im 62/102  soft-tissue]
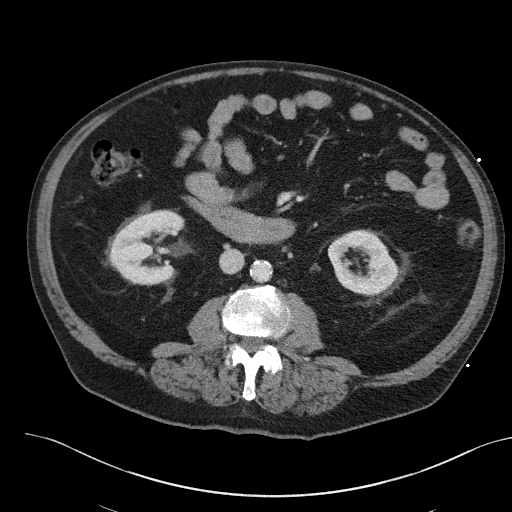
[im 62/102  bone]
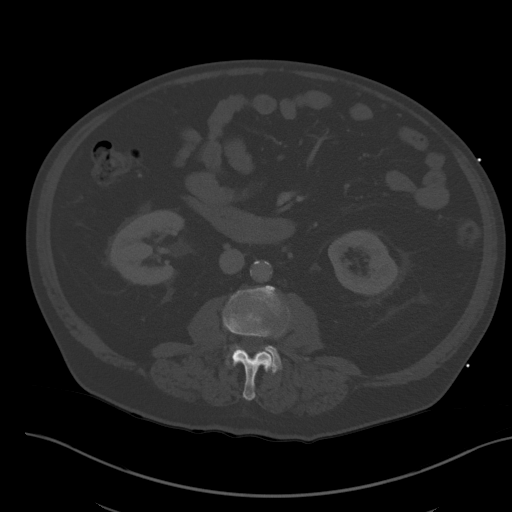
[im 68/102  soft-tissue]
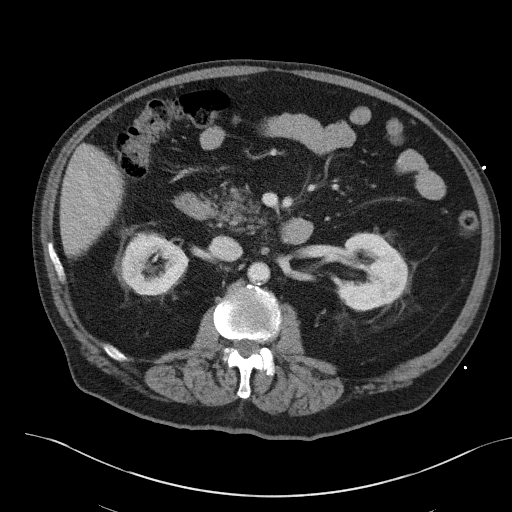
[im 73/102  soft-tissue]
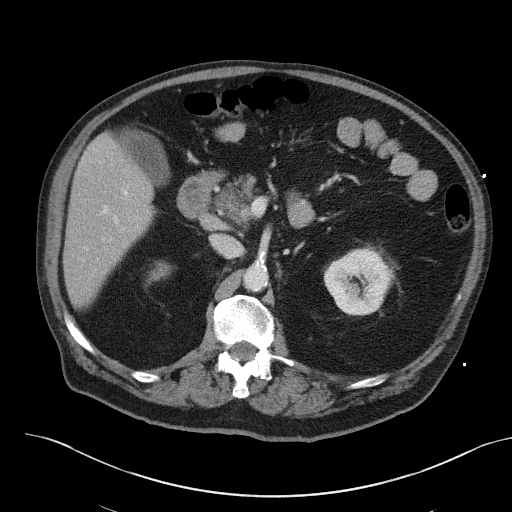
[im 79/102  soft-tissue]
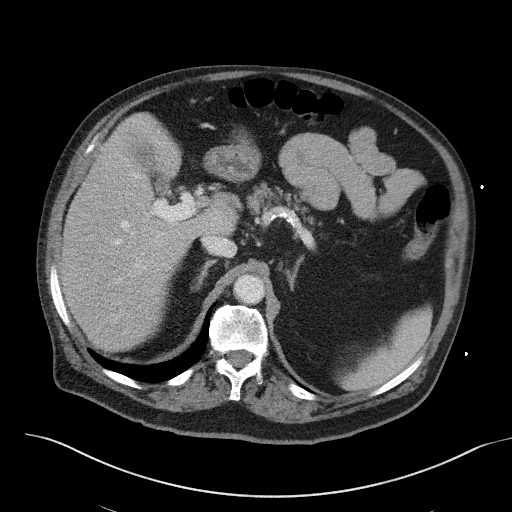
[im 90/102  soft-tissue]
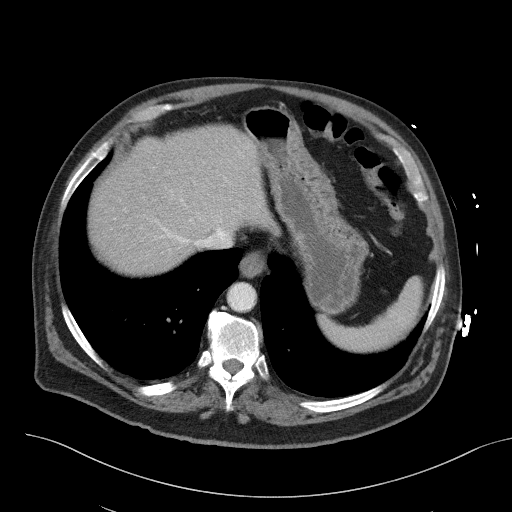
[im 96/102  soft-tissue]
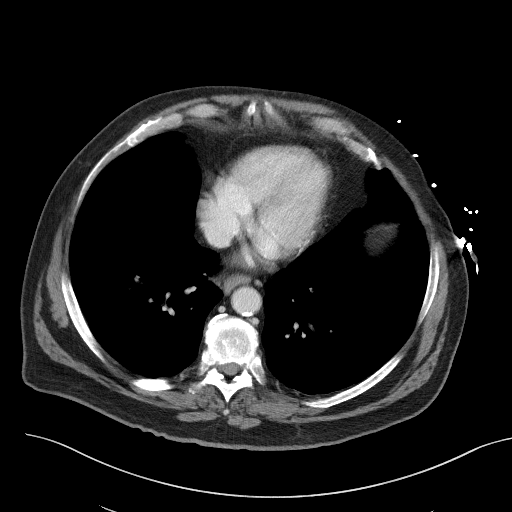

[Series 5: coronal st · coronal · 0.88mm/px · 3 of 118 slices shown]
[im 40/118  soft-tissue]
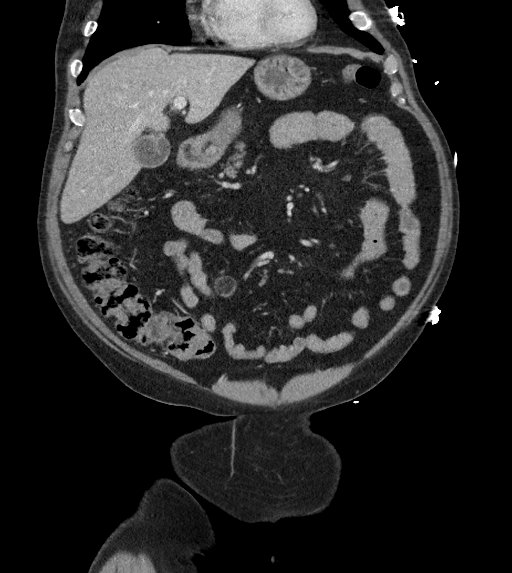
[im 53/118  soft-tissue]
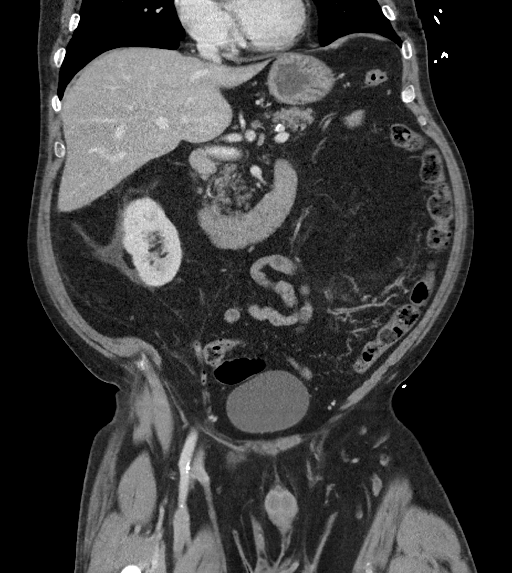
[im 66/118  soft-tissue]
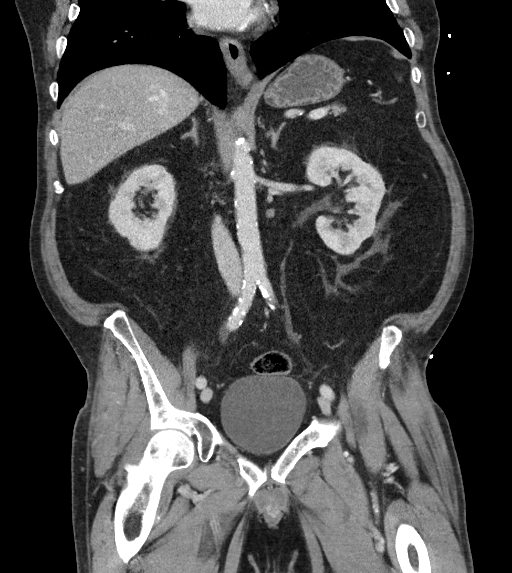

[17 of 46 positions shown; findings below may reference images not displayed]

FINDINGS: Lower chest: The visualized lung bases are clear. There is coronary
vascular calcification.

No intra-abdominal free air or free fluid.

Hepatobiliary: Subcentimeter hepatic hypodense focus is too small to
characterize. No intrahepatic biliary dilatation. No calcified
gallstone or pericholecystic fluid.

Pancreas: Unremarkable. No pancreatic ductal dilatation or
surrounding inflammatory changes.

Spleen: Normal in size without focal abnormality.

Adrenals/Urinary Tract: The adrenal glands are unremarkable. The
kidneys, visualized ureters, and urinary bladder appear
unremarkable.

Stomach/Bowel: There is no bowel obstruction or active inflammation.
The appendix is normal.

Vascular/Lymphatic: Moderate aortoiliac atherosclerotic disease. The
IVC is unremarkable. No portal venous gas. There is no adenopathy.

Reproductive: The prostate and seminal vesicles are grossly
unremarkable. No pelvic mass.

Other: None

Musculoskeletal: Degenerative changes of the spine. No acute osseous
pathology.
IMPRESSION: 1. No acute intra-abdominal or pelvic pathology.
2. Aortic Atherosclerosis ([id number]).

## 2022-09-23 ENCOUNTER — Encounter: Payer: Self-pay | Admitting: Internal Medicine

## 2023-08-03 ENCOUNTER — Emergency Department (HOSPITAL_COMMUNITY): Payer: Medicare Other

## 2023-08-03 ENCOUNTER — Emergency Department (HOSPITAL_COMMUNITY)
Admission: EM | Admit: 2023-08-03 | Discharge: 2023-08-03 | Disposition: A | Discharge status: Home / Self Care | Payer: Medicare Other | Attending: Emergency Medicine | Admitting: Emergency Medicine

## 2023-08-03 ENCOUNTER — Other Ambulatory Visit: Payer: Self-pay

## 2023-08-03 DIAGNOSIS — E119 Type 2 diabetes mellitus without complications: Secondary | ICD-10-CM | POA: Insufficient documentation

## 2023-08-03 DIAGNOSIS — Z87891 Personal history of nicotine dependence: Secondary | ICD-10-CM | POA: Insufficient documentation

## 2023-08-03 DIAGNOSIS — R1031 Right lower quadrant pain: Secondary | ICD-10-CM | POA: Insufficient documentation

## 2023-08-03 DIAGNOSIS — I10 Essential (primary) hypertension: Secondary | ICD-10-CM | POA: Insufficient documentation

## 2023-08-03 LAB — COMPREHENSIVE METABOLIC PANEL
ALT: 30 U/L (ref 0–44)
AST: 19 U/L (ref 15–41)
Albumin: 4.4 g/dL (ref 3.5–5.0)
Alkaline Phosphatase: 67 U/L (ref 38–126)
Anion gap: 11 (ref 5–15)
BUN: 23 mg/dL (ref 8–23)
CO2: 21 mmol/L — ABNORMAL LOW (ref 22–32)
Calcium: 9.8 mg/dL (ref 8.9–10.3)
Chloride: 105 mmol/L (ref 98–111)
Creatinine, Ser: 1.05 mg/dL (ref 0.61–1.24)
GFR, Estimated: >60 mL/min (ref >60)
Glucose, Bld: 132 mg/dL — ABNORMAL HIGH (ref 70–99)
Potassium: 3.8 mmol/L (ref 3.5–5.1)
Sodium: 137 mmol/L (ref 135–145)
Total Bilirubin: 0.8 mg/dL (ref <1.2)
Total Protein: 7.1 g/dL (ref 6.5–8.1)

## 2023-08-03 LAB — URINALYSIS, ROUTINE W REFLEX MICROSCOPIC
Bacteria, UA: NONE SEEN
Bilirubin Urine: NEGATIVE
Glucose, UA: >=500 mg/dL — AB
Hgb urine dipstick: NEGATIVE
Ketones, ur: 5 mg/dL — AB
Leukocytes,Ua: NEGATIVE
Nitrite: NEGATIVE
Protein, ur: NEGATIVE mg/dL
Specific Gravity, Urine: 1.017 (ref 1.005–1.030)
pH: 6 (ref 5.0–8.0)

## 2023-08-03 LAB — CBC
HCT: 45.7 % (ref 39.0–52.0)
Hemoglobin: 15.2 g/dL (ref 13.0–17.0)
MCH: 29.2 pg (ref 26.0–34.0)
MCHC: 33.3 g/dL (ref 30.0–36.0)
MCV: 87.7 fL (ref 80.0–100.0)
Platelets: 229 10*3/uL (ref 150–400)
RBC: 5.21 MIL/uL (ref 4.22–5.81)
RDW: 14 % (ref 11.5–15.5)
WBC: 7.4 10*3/uL (ref 4.0–10.5)
nRBC: 0 % (ref 0.0–0.2)

## 2023-08-03 LAB — LIPASE, BLOOD: Lipase: 33 U/L (ref 11–51)

## 2023-08-03 MED ORDER — IOHEXOL 300 MG/ML  SOLN
100.0000 mL | Freq: Once | INTRAMUSCULAR | Status: AC | PRN
  Administered 2023-08-03: 100 mL via INTRAVENOUS

## 2023-08-03 MED ORDER — NAPROXEN 500 MG PO TABS: 500.0000 mg | ORAL_TABLET | Freq: Two times a day (BID) | ORAL | 0 refills | Status: DC

## 2023-08-03 NOTE — ED Notes (Signed)
Patient transported to CT 

## 2023-08-03 NOTE — ED Provider Notes (Signed)
[facility] Emergency Department Provider Note MRN:  [id number]  Arrival date & time: 08/03/23     Chief Complaint   Abdominal Pain   History of Present Illness   Dustin Monroe is a 77 y.o. year-old male with a history of hypertension, diabetes presenting to the ED with chief complaint of abdominal pain.  Right lower quadrant abdominal pain which she explains he is used to and has had for years, normally very mild and dull.  Tonight feels like someone has put a knife in his abdomen, much more severe, much more sharp.  Concerned that something different is going on.  Denies nausea vomiting or diarrhea, no constipation, no fever.  Review of Systems  A thorough review of systems was obtained and all systems are negative except as noted in the HPI and PMH.   Patient's Health History    Past Medical History:  Diagnosis Date   Angina effort    DJD (degenerative joint disease)    DM (diabetes mellitus) (HCC)    GERD (gastroesophageal reflux disease)    HTN (hypertension)    Hyperlipemia     Past Surgical History:  Procedure Laterality Date   BALLOON DILATION N/A 05/04/2022   Procedure: BALLOON DILATION;  Surgeon: Lanelle Bal, DO;  Location: [facility] ENDO SUITE;  Service: Endoscopy;  Laterality: N/A;   BIOPSY  05/04/2022   Procedure: BIOPSY;  Surgeon: Lanelle Bal, DO;  Location: [facility];  Service: Endoscopy;;   ESOPHAGOGASTRODUODENOSCOPY (EGD) WITH PROPOFOL N/A 05/04/2022   Procedure: ESOPHAGOGASTRODUODENOSCOPY (EGD) WITH PROPOFOL;  Surgeon: Lanelle Bal, DO;  Location: [facility];  Service: Endoscopy;  Laterality: N/A;  10:00 AM   KNEE ARTHROSCOPY Bilateral    PILONIDAL CYST EXCISION      Family History  Problem Relation Age of Onset   Diabetes Mother    Hypertension Mother    Prostate cancer Father    Diabetes Father    Hypertension Father    Hypertension Sister    Hypertension Sister    Hypertension Brother    Hypertension  Brother    Hypertension Brother    Colon cancer Neg Hx     Social History   Socioeconomic History   Marital status: Single    Spouse name: Not on file   Number of children: Not on file   Years of education: Not on file   Highest education level: Not on file  Occupational History   Not on file  Tobacco Use   Smoking status: Former    Types: Cigarettes   Smokeless tobacco: Never  Vaping Use   Vaping status: Never Used  Substance and Sexual Activity   Alcohol use: No   Drug use: No   Sexual activity: Not on file  Other Topics Concern   Not on file  Social History Narrative   Not on file   Social Determinants of Health   Financial Resource Strain: Not on file  Food Insecurity: Not on file  Transportation Needs: Not on file  Physical Activity: Not on file  Stress: Not on file  Social Connections: Not on file  Intimate Partner Violence: Not on file     Physical Exam   Vitals:   08/03/23 0543 08/03/23 0545  BP:  (!) 147/87  Pulse: 83 84  Temp: 97.8 F (36.6 C)   SpO2: 96% 96%    CONSTITUTIONAL: Chronically ill-appearing, NAD NEURO/PSYCH:  Alert and oriented x 3, no focal deficits EYES:  eyes equal and  reactive ENT/NECK:  no LAD, no JVD CARDIO: Regular rate, well-perfused, normal S1 and S2 PULM:  CTAB no wheezing or rhonchi GI/GU:  non-distended, non-tender MSK/SPINE:  No gross deformities, no edema SKIN:  no rash, atraumatic   *Additional and/or pertinent findings included in MDM below  Diagnostic and Interventional Summary    EKG Interpretation Date/Time:    Ventricular Rate:    PR Interval:    QRS Duration:    QT Interval:    QTC Calculation:   R Axis:      Text Interpretation:         Labs Reviewed  COMPREHENSIVE METABOLIC PANEL - Abnormal; Notable for the following components:      Result Value   CO2 21 (*)    Glucose, Bld 132 (*)    All other components within normal limits  URINALYSIS, ROUTINE W REFLEX MICROSCOPIC - Abnormal; Notable  for the following components:   Color, Urine STRAW (*)    Glucose, UA >=500 (*)    Ketones, ur 5 (*)    All other components within normal limits  LIPASE, BLOOD  CBC    CT ABDOMEN PELVIS W CONTRAST  Final Result      Medications  iohexol (OMNIPAQUE) 300 MG/ML solution 100 mL (100 mLs Intravenous Contrast Given 08/03/23 0450)     Procedures  /  Critical Care Procedures  ED Course and Medical Decision Making  Initial Impression and Ddx Differential diagnosis includes diverticulitis, appendicitis, kidney stone, colitis, UTI, pyelonephritis, neoplastic process  Past medical/surgical history that increases complexity of ED encounter: Chronic abdominal pain  Interpretation of Diagnostics I personally reviewed the laboratory assessment and my interpretation is as follows: No significant blood count or electrolyte disturbance  CT abdomen unremarkable.  Patient Reassessment and Ultimate Disposition/Management     Patient continues to look well with normal vital signs, with reassuring workup there is no emergent process evident and patient is appropriate for discharge.  Patient management required discussion with the following services or consulting groups:  None  Complexity of Problems Addressed Acute illness or injury that poses threat of life of bodily function  Additional Data Reviewed and Analyzed Further history obtained from: Prior labs/imaging results  Additional Factors Impacting ED Encounter Risk Consideration of hospitalization  Elmer Sow. Pilar Plate, MD [facility] Emergency Medicine [facility] [email or web address]  Final Clinical Impressions(s) / ED Diagnoses     ICD-10-CM   1. Right lower quadrant abdominal pain  R10.31       ED Discharge Orders          Ordered    naproxen (NAPROSYN) 500 MG tablet  2 times daily        08/03/23 2956             Discharge Instructions Discussed with and Provided to Patient:     Discharge  Instructions      You were evaluated in the Emergency Department and after careful evaluation, we did not find any emergent condition requiring admission or further testing in the hospital.  Your exam/testing today is overall reassuring.  Recommend follow-up with your primary care doctor to discuss your symptoms.  Can use the Naprosyn twice daily as needed for pain.  Please return to the Emergency Department if you experience any worsening of your condition.   Thank you for allowing [place] to be a part of your care.       Sabas Sous, MD 08/03/23 [phone]

## 2023-08-03 NOTE — Discharge Instructions (Addendum)
You were evaluated in the Emergency Department and after careful evaluation, we did not find any emergent condition requiring admission or further testing in the hospital.  Your exam/testing today is overall reassuring.  Recommend follow-up with your primary care doctor to discuss your symptoms.  Can use the Naprosyn twice daily as needed for pain.  Please return to the Emergency Department if you experience any worsening of your condition.   Thank you for allowing [place] to be a part of your care.

## 2023-08-03 NOTE — ED Triage Notes (Signed)
Pt bib [facility] from home c/o RLQ pain that he has had for "years" but states tonight the pain is worse then usual.

## 2023-08-03 NOTE — ED Notes (Signed)
Pt returned from ct

## 2024-09-11 ENCOUNTER — Other Ambulatory Visit: Payer: Self-pay

## 2024-09-11 ENCOUNTER — Emergency Department (HOSPITAL_COMMUNITY)

## 2024-09-11 ENCOUNTER — Observation Stay (HOSPITAL_COMMUNITY): Admission: EM | Admit: 2024-09-11 | Discharge: 2024-09-13 | Disposition: A | Discharge status: Home / Self Care

## 2024-09-11 ENCOUNTER — Encounter (HOSPITAL_COMMUNITY): Payer: Self-pay

## 2024-09-11 DIAGNOSIS — E785 Hyperlipidemia, unspecified: Secondary | ICD-10-CM | POA: Diagnosis not present

## 2024-09-11 DIAGNOSIS — I10 Essential (primary) hypertension: Secondary | ICD-10-CM | POA: Diagnosis not present

## 2024-09-11 DIAGNOSIS — R55 Syncope and collapse: Secondary | ICD-10-CM | POA: Diagnosis present

## 2024-09-11 DIAGNOSIS — N179 Acute kidney failure, unspecified: Secondary | ICD-10-CM | POA: Insufficient documentation

## 2024-09-11 DIAGNOSIS — E119 Type 2 diabetes mellitus without complications: Secondary | ICD-10-CM | POA: Diagnosis not present

## 2024-09-11 DIAGNOSIS — Z79899 Other long term (current) drug therapy: Secondary | ICD-10-CM | POA: Insufficient documentation

## 2024-09-11 DIAGNOSIS — E782 Mixed hyperlipidemia: Secondary | ICD-10-CM | POA: Diagnosis not present

## 2024-09-11 DIAGNOSIS — R7401 Elevation of levels of liver transaminase levels: Secondary | ICD-10-CM | POA: Insufficient documentation

## 2024-09-11 DIAGNOSIS — Z87891 Personal history of nicotine dependence: Secondary | ICD-10-CM | POA: Diagnosis not present

## 2024-09-11 DIAGNOSIS — Z8679 Personal history of other diseases of the circulatory system: Secondary | ICD-10-CM | POA: Diagnosis not present

## 2024-09-11 DIAGNOSIS — E872 Acidosis, unspecified: Secondary | ICD-10-CM | POA: Diagnosis present

## 2024-09-11 LAB — TROPONIN T, HIGH SENSITIVITY
Troponin T High Sensitivity: 16 ng/L (ref 0–19)
Troponin T High Sensitivity: <15 ng/L (ref 0–19)

## 2024-09-11 LAB — COMPREHENSIVE METABOLIC PANEL WITH GFR
ALT: 46 U/L — ABNORMAL HIGH (ref 0–44)
AST: 52 U/L — ABNORMAL HIGH (ref 15–41)
Albumin: 4 g/dL (ref 3.5–5.0)
Alkaline Phosphatase: 68 U/L (ref 38–126)
Anion gap: 15 (ref 5–15)
BUN: 33 mg/dL — ABNORMAL HIGH (ref 8–23)
CO2: 19 mmol/L — ABNORMAL LOW (ref 22–32)
Calcium: 8.9 mg/dL (ref 8.9–10.3)
Chloride: 101 mmol/L (ref 98–111)
Creatinine, Ser: 1.72 mg/dL — ABNORMAL HIGH (ref 0.61–1.24)
GFR, Estimated: 40 mL/min — ABNORMAL LOW
Glucose, Bld: 241 mg/dL — ABNORMAL HIGH (ref 70–99)
Potassium: 4.4 mmol/L (ref 3.5–5.1)
Sodium: 135 mmol/L (ref 135–145)
Total Bilirubin: 0.5 mg/dL (ref 0.0–1.2)
Total Protein: 6.5 g/dL (ref 6.5–8.1)

## 2024-09-11 LAB — URINALYSIS, ROUTINE W REFLEX MICROSCOPIC
Bilirubin Urine: NEGATIVE
Glucose, UA: >=500 mg/dL — AB
Ketones, ur: 5 mg/dL — AB
Leukocytes,Ua: NEGATIVE
Nitrite: NEGATIVE
Protein, ur: 100 mg/dL — AB
Specific Gravity, Urine: 1.026 (ref 1.005–1.030)
pH: 5 (ref 5.0–8.0)

## 2024-09-11 LAB — PRO BRAIN NATRIURETIC PEPTIDE: Pro Brain Natriuretic Peptide: 115 pg/mL

## 2024-09-11 LAB — CBC WITH DIFFERENTIAL/PLATELET
Abs Immature Granulocytes: 0.09 K/uL — ABNORMAL HIGH (ref 0.00–0.07)
Basophils Absolute: 0 K/uL (ref 0.0–0.1)
Basophils Relative: 0 %
Eosinophils Absolute: 0 K/uL (ref 0.0–0.5)
Eosinophils Relative: 0 %
HCT: 50.1 % (ref 39.0–52.0)
Hemoglobin: 16.1 g/dL (ref 13.0–17.0)
Immature Granulocytes: 1 %
Lymphocytes Relative: 10 %
Lymphs Abs: 1 K/uL (ref 0.7–4.0)
MCH: 28.9 pg (ref 26.0–34.0)
MCHC: 32.1 g/dL (ref 30.0–36.0)
MCV: 89.9 fL (ref 80.0–100.0)
Monocytes Absolute: 1.5 K/uL — ABNORMAL HIGH (ref 0.1–1.0)
Monocytes Relative: 16 %
Neutro Abs: 7.2 K/uL (ref 1.7–7.7)
Neutrophils Relative %: 73 %
Platelets: 168 K/uL (ref 150–400)
RBC: 5.57 MIL/uL (ref 4.22–5.81)
RDW: 14.2 % (ref 11.5–15.5)
WBC: 9.8 K/uL (ref 4.0–10.5)
nRBC: 0 % (ref 0.0–0.2)

## 2024-09-11 LAB — I-STAT CHEM 8, ED
BUN: 37 mg/dL — ABNORMAL HIGH (ref 8–23)
Calcium, Ion: 1.13 mmol/L — ABNORMAL LOW (ref 1.15–1.40)
Chloride: 105 mmol/L (ref 98–111)
Creatinine, Ser: 1.8 mg/dL — ABNORMAL HIGH (ref 0.61–1.24)
Glucose, Bld: 234 mg/dL — ABNORMAL HIGH (ref 70–99)
HCT: 47 % (ref 39.0–52.0)
Hemoglobin: 16 g/dL (ref 13.0–17.0)
Potassium: 4.2 mmol/L (ref 3.5–5.1)
Sodium: 137 mmol/L (ref 135–145)
TCO2: 20 mmol/L — ABNORMAL LOW (ref 22–32)

## 2024-09-11 LAB — LIPASE, BLOOD: Lipase: 29 U/L (ref 11–51)

## 2024-09-11 LAB — MAGNESIUM: Magnesium: 2.2 mg/dL (ref 1.7–2.4)

## 2024-09-11 LAB — URINE DRUG SCREEN
Amphetamines: NEGATIVE
Barbiturates: NEGATIVE
Benzodiazepines: NEGATIVE
Cocaine: NEGATIVE
Fentanyl: NEGATIVE
Methadone Scn, Ur: NEGATIVE
Opiates: NEGATIVE
Tetrahydrocannabinol: NEGATIVE

## 2024-09-11 LAB — I-STAT CG4 LACTIC ACID, ED
Lactic Acid, Venous: 3.6 mmol/L (ref 0.5–1.9)
Lactic Acid, Venous: 1.9 mmol/L (ref 0.5–1.9)

## 2024-09-11 LAB — CBG MONITORING, ED: Glucose-Capillary: 211 mg/dL — ABNORMAL HIGH (ref 70–99)

## 2024-09-11 LAB — ETHANOL: Alcohol, Ethyl (B): <15 mg/dL

## 2024-09-11 MED ORDER — METOPROLOL TARTRATE 25 MG PO TABS
25.0000 mg | ORAL_TABLET | Freq: Two times a day (BID) | ORAL | Status: DC
  Administered 2024-09-11 – 2024-09-13 (×4): 25 mg via ORAL
  Filled 2024-09-11 (×4): qty 1

## 2024-09-11 MED ORDER — MELATONIN 3 MG PO TABS
3.0000 mg | ORAL_TABLET | Freq: Every evening | ORAL | Status: DC | PRN
  Administered 2024-09-12: 3 mg via ORAL
  Filled 2024-09-11: qty 1

## 2024-09-11 MED ORDER — ONDANSETRON HCL 4 MG/2ML IJ SOLN: 4.0000 mg | Freq: Four times a day (QID) | INTRAMUSCULAR | Status: DC | PRN

## 2024-09-11 MED ORDER — ACETAMINOPHEN 650 MG RE SUPP: 650.0000 mg | Freq: Four times a day (QID) | RECTAL | Status: DC | PRN

## 2024-09-11 MED ORDER — SODIUM CHLORIDE 0.9 % IV BOLUS
1000.0000 mL | Freq: Once | INTRAVENOUS | Status: AC
  Administered 2024-09-11: 1000 mL via INTRAVENOUS

## 2024-09-11 MED ORDER — IOHEXOL 350 MG/ML SOLN
75.0000 mL | Freq: Once | INTRAVENOUS | Status: AC | PRN
  Administered 2024-09-11: 75 mL via INTRAVENOUS

## 2024-09-11 MED ORDER — ACETAMINOPHEN 325 MG PO TABS: 650.0000 mg | ORAL_TABLET | Freq: Four times a day (QID) | ORAL | Status: DC | PRN
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
## 2024-09-11 NOTE — H&P (Signed)
 " History and Physical      Dustin Monroe FMW:[id number] DOB: 26-Feb-1946 DOA: 09/11/2024; DOS: 09/11/2024  PCP: Dustin Monroe Dustin MADISON, FNP *** Patient coming from: home ***  I have personally briefly reviewed patient's old medical records in [facility]  Chief Complaint: ***  HPI: Dustin Monroe is a 78 y.o. male with medical history significant for *** who is admitted to [facility] on 09/11/2024 with *** after presenting from home*** to [facility] ED complaining of ***.    ***       ***   ED Course:  Vital signs in the ED were notable for the following: ***  Labs were notable for the following: ***  Per my interpretation, EKG in ED demonstrated the following:  ***  Imaging in the ED, per corresponding formal radiology read, was notable for the following:  ***  While in the ED, the following were administered: ***  Subsequently, the patient was admitted  ***  ***red    Review of Systems: As per HPI otherwise 10 point review of systems negative.   Past Medical History:  Diagnosis Date   Angina effort    DJD (degenerative joint disease)    DM (diabetes mellitus) (HCC)    GERD (gastroesophageal reflux disease)    HTN (hypertension)    Hyperlipemia     Past Surgical History:  Procedure Laterality Date   BALLOON DILATION N/A 05/04/2022   Procedure: BALLOON DILATION;  Surgeon: Cindie Carlin POUR, DO;  Location: [facility];  Service: Endoscopy;  Laterality: N/A;   BIOPSY  05/04/2022   Procedure: BIOPSY;  Surgeon: Cindie Carlin POUR, DO;  Location: [facility];  Service: Endoscopy;;   ESOPHAGOGASTRODUODENOSCOPY (EGD) WITH PROPOFOL  N/A 05/04/2022   Procedure: ESOPHAGOGASTRODUODENOSCOPY (EGD) WITH PROPOFOL ;  Surgeon: Cindie Carlin POUR, DO;  Location: [facility];  Service: Endoscopy;  Laterality: N/A;  10:00 AM   KNEE ARTHROSCOPY Bilateral    PILONIDAL CYST EXCISION      Social History:  reports that he has quit smoking. His smoking use  included cigarettes. He has never used smokeless tobacco. He reports that he does not drink alcohol and does not use drugs.   Allergies[1]  Family History  Problem Relation Age of Onset   Diabetes Mother    Hypertension Mother    Prostate cancer Father    Diabetes Father    Hypertension Father    Hypertension Sister    Hypertension Sister    Hypertension Brother    Hypertension Brother    Hypertension Brother    Colon cancer Neg Hx     Family history reviewed and not pertinent ***   Prior to Admission medications  Medication Sig Start Date End Date Taking? Authorizing Provider  amLODipine  (NORVASC ) 5 MG tablet Take 1 tablet (5 mg total) by mouth daily. 11/14/17 05/04/22  Koneswaran, Suresh A, MD  Ascorbic Acid (VITAMIN C) 1000 MG tablet Take 1,000 mg by mouth daily.    [provider]  aspirin  EC 81 MG tablet Take 81 mg by mouth daily.    [provider]  atorvastatin  (LIPITOR) 20 MG tablet Take 1 tablet (20 mg total) by mouth daily at 6 PM. 08/31/21   Johnson, Clanford L, MD  Cholecalciferol (DIALYVITE VITAMIN D 5000) 125 MCG (5000 UT) capsule Take 5,000 Units by mouth daily.    [provider]  HYDROcodone -acetaminophen  (NORCO) 7.5-325 MG tablet Take 1 tablet by mouth 3 (three) times daily. Patient taking differently: Take 1  tablet by mouth 2 (two) times daily as needed for moderate pain. 08/31/21   Johnson, Clanford L, MD  losartan  (COZAAR ) 25 MG tablet Take 1 tablet (25 mg total) by mouth daily. 08/24/21   Johnson, Clanford L, MD  Magnesium  400 MG TABS Take 400 mg by mouth daily.    [provider]  metFORMIN (GLUCOPHAGE) 1000 MG tablet Take 1,000 mg by mouth daily after supper.    [provider]  metoprolol  tartrate (LOPRESSOR ) 25 MG tablet Take 1 tablet (25 mg total) by mouth 2 (two) times daily. 03/10/22   Delford Maude BROCKS, MD  naproxen  (NAPROSYN ) 500 MG tablet Take 1 tablet (500 mg total) by mouth 2 (two) times daily. 08/03/23    Theadore Ozell HERO, MD  naproxen  sodium (ALEVE ) 220 MG tablet Take 440 mg by mouth daily as needed (pain).    [provider]  pantoprazole  (PROTONIX ) 40 MG tablet Take 1 tablet (40 mg total) by mouth daily. 04/28/22   Rudy Josette RAMAN, PA-C  Simethicone (GAS-X PO) Take 2 tablets by mouth daily as needed (gas).    [provider]     Objective    Physical Exam: Vitals:   09/11/24 1638 09/11/24 1645 09/11/24 1715 09/11/24 1845  BP: (!) 153/65 136/70 (!) 160/83 132/84  Pulse: 97 (!) 105 (!) 101 100  Resp: 18 15 19 15   Temp: 98 F (36.7 C)     TempSrc: Oral     SpO2: 100% 98% 100% 97%    General: appears to be stated age; alert, oriented Skin: warm, dry, no rash Head:  AT/[place] Mouth:  Oral mucosa membranes appear moist, normal dentition Neck: supple; trachea midline Heart:  RRR; did not appreciate any M/R/G Lungs: CTAB, did not appreciate any wheezes, rales, or rhonchi Abdomen: + BS; soft, ND, NT Vascular: 2+ pedal pulses b/l; 2+ radial pulses b/l Extremities: no peripheral edema, no muscle wasting   ***   *** Neuro: strength and sensation intact in upper and lower extremities b/l  *** Neuro: 5/5 strength of the proximal and distal flexors and extensors of the upper and lower extremities bilaterally; sensation intact in upper and lower extremities b/l; cranial nerves II through XII grossly intact; no pronator drift; no evidence suggestive of slurred speech, dysarthria, or facial droop; Normal muscle tone. No tremors. *** Neuro: In the setting of the patient's current mental status and associated inability to follow instructions, unable to perform full neurologic exam at this time.  As such, assessment of strength, sensation, and cranial nerves is limited at this time. Patient noted to spontaneously move all 4 extremities. No tremors.  ***        Labs on Admission: I have personally reviewed following labs and imaging studies  CBC: Recent Labs  Lab  09/11/24 1650 09/11/24 1709  WBC 9.8  --   NEUTROABS 7.2  --   HGB 16.1 16.0  HCT 50.1 47.0  MCV 89.9  --   PLT 168  --    Basic Metabolic Panel: Recent Labs  Lab 09/11/24 1650 09/11/24 1709  NA 135 137  K 4.4 4.2  CL 101 105  CO2 19*  --   GLUCOSE 241* 234*  BUN 33* 37*  CREATININE 1.72* 1.80*  CALCIUM  8.9  --    GFR: CrCl cannot be calculated (Unknown ideal weight.). Liver Function Tests: Recent Labs  Lab 09/11/24 1650  AST 52*  ALT 46*  ALKPHOS 68  BILITOT 0.5  PROT 6.5  ALBUMIN 4.0  Recent Labs  Lab 09/11/24 1650  LIPASE 29   No results for input(s): AMMONIA in the last 168 hours. Coagulation Profile: No results for input(s): INR, PROTIME in the last 168 hours. Cardiac Enzymes: No results for input(s): CKTOTAL, CKMB, CKMBINDEX, TROPONINI in the last 168 hours. BNP (last 3 results) Recent Labs    09/11/24 1650  PROBNP 115.0   HbA1C: No results for input(s): HGBA1C in the last 72 hours. CBG: Recent Labs  Lab 09/11/24 1657  GLUCAP 211*   Lipid Profile: No results for input(s): CHOL, HDL, LDLCALC, TRIG, CHOLHDL, LDLDIRECT in the last 72 hours. Thyroid  Function Tests: No results for input(s): TSH, T4TOTAL, FREET4, T3FREE, THYROIDAB in the last 72 hours. Anemia Panel: No results for input(s): VITAMINB12, FOLATE, FERRITIN, TIBC, IRON, RETICCTPCT in the last 72 hours. Urine analysis:    Component Value Date/Time   COLORURINE YELLOW 09/11/2024 1650   APPEARANCEUR HAZY (A) 09/11/2024 1650   LABSPEC 1.026 09/11/2024 1650   PHURINE 5.0 09/11/2024 1650   GLUCOSEU >=500 (A) 09/11/2024 1650   HGBUR SMALL (A) 09/11/2024 1650   BILIRUBINUR NEGATIVE 09/11/2024 1650   KETONESUR 5 (A) 09/11/2024 1650   PROTEINUR 100 (A) 09/11/2024 1650   NITRITE NEGATIVE 09/11/2024 1650   LEUKOCYTESUR NEGATIVE 09/11/2024 1650    Radiological Exams on Admission: CT CHEST ABDOMEN PELVIS W CONTRAST Result Date:  09/11/2024 EXAM: CT CHEST, ABDOMEN AND PELVIS WITH CONTRAST 09/11/2024 05:32:58 PM TECHNIQUE: CT of the chest, abdomen and pelvis was performed with the administration of 75 mL of iohexol  (OMNIPAQUE ) 350 MG/ML injection. Multiplanar reformatted images are provided for review. Automated exposure control, iterative reconstruction, and/or weight based adjustment of the mA/kV was utilized to reduce the radiation dose to as low as reasonably achievable. COMPARISON: None available. CLINICAL HISTORY: Polytrauma, blunt. FINDINGS: CHEST: MEDIASTINUM AND LYMPH NODES: Heart and pericardium are unremarkable. The central airways are clear. No mediastinal, hilar or axillary lymphadenopathy. Moderate atherosclerotic plaque. The main pulmonary artery is normal in caliber. 4-vessel coronary artery calcifications. Esophagus is unremarkable. Possible tiny hiatal hernia. LUNGS AND PLEURA: Diffuse mild bronchial wall thickening. Right upper lobe linear atelectasis. No focal consolidation or pulmonary edema. No pleural effusion or pneumothorax. ABDOMEN AND PELVIS: LIVER: The liver is unremarkable. GALLBLADDER AND BILE DUCTS: Gallbladder is unremarkable. No biliary ductal dilatation. SPLEEN: No acute abnormality. PANCREAS: Diffusely atrophic pancreas. No focal lesion. Otherwise normal pancreatic contour. No surrounding inflammatory changes. No main pancreatic ductal dilatation. ADRENAL GLANDS: No acute abnormality. KIDNEYS, URETERS AND BLADDER: No stones in the kidneys or ureters. No hydronephrosis. Nonspecific bilateral perinephric fat stranding. Urinary bladder is unremarkable. GI AND BOWEL: Stomach demonstrates no acute abnormality. No small or large bowel thickening or dilatation. Slightly prominent ileocecal valve. APPENDIX: The appendix is unremarkable. REPRODUCTIVE ORGANS: The prostate is normal in caliber. PERITONEUM AND RETROPERITONEUM: No ascites. No free air. No mesenteric hematoma. VASCULATURE: Aorta is normal in caliber.  Severe atherosclerotic plaque. ABDOMINAL AND PELVIS LYMPH NODES: No lymphadenopathy. BONES AND SOFT TISSUES: No acute displaced fracture or dislocation of either bilateral partially visualized shoulders or bilateral hips. No acute displaced fracture or diastasis of the bones of the pelvis. No acute sacral fracture. No acute displaced rib fracture. No acute displaced sternal fracture. Old healed right anterior rib fractures. No large soft tissue hematoma. No focal soft tissue abnormality. Please see separately dictated CT thoracolumbar spine at 09/11/2024. IMPRESSION: 1. No acute traumatic injury in the chest, abdomen, or pelvis 2. No acute fracture. 3. Please see separately dictated CT thoracolumbar spine  at 09/11/2024. 4. Severe atherosclerotic plaque. 5. Other, non-acute and/or normal findings as above. Electronically signed by: Morgane Naveau MD 09/11/2024 06:13 PM EST RP Workstation: [id number]   CT L-SPINE NO CHARGE Result Date: 09/11/2024 EXAM: CT OF THE LUMBAR SPINE WITHOUT CONTRAST 09/11/2024 05:32:58 PM TECHNIQUE: CT of the lumbar spine was performed without the administration of intravenous contrast. Multiplanar reformatted images are provided for review. Automated exposure control, iterative reconstruction, and/or weight based adjustment of the mA/kV was utilized to reduce the radiation dose to as low as reasonably achievable. COMPARISON: ct a/p 08/22/21 CLINICAL HISTORY: FINDINGS: BONES AND ALIGNMENT: Normal vertebral body heights except for chronic-appearing vertebral body height loss of the L5 level. Chronic densly sclerotic lesion within the L5 vertebral body likely a bone island. No acute fracture. Normal alignment. DEGENERATIVE CHANGES: Multilevel intervertebral disc space vacuum phenomenon. Multilevel osteophyte formation anteriorly. SOFT TISSUES: No acute abnormality. IMPRESSION: 1. No acute findings. Electronically signed by: Morgane Naveau MD 09/11/2024 06:08 PM EST RP Workstation:  [id number]   CT T-SPINE NO CHARGE Result Date: 09/11/2024 EXAM: CT THORACIC SPINE WITHOUT CONTRAST 09/11/2024 05:32:58 PM TECHNIQUE: CT of the thoracic spine was performed without the administration of intravenous contrast. Multiplanar reformatted images are provided for review. Automated exposure control, iterative reconstruction, and/or weight based adjustment of the mA/kV was utilized to reduce the radiation dose to as low as reasonably achievable. COMPARISON: None available. CLINICAL HISTORY: FINDINGS: BONES AND ALIGNMENT: Normal vertebral body heights. No acute fracture or suspicious bone lesion. Normal alignment. DEGENERATIVE CHANGES: Multilevel mild degenerative changes of the spine. No severe osseous neural foraminal or central canal stenosis. SOFT TISSUES: No acute abnormality. IMPRESSION: 1. No acute abnormality of the thoracic spine. Electronically signed by: Morgane Naveau MD 09/11/2024 06:03 PM EST RP Workstation: [id number]   CT Head Wo Contrast Result Date: 09/11/2024 EXAM: CT HEAD WITHOUT CONTRAST 09/11/2024 05:32:58 PM TECHNIQUE: CT of the head was performed without the administration of intravenous contrast. Automated exposure control, iterative reconstruction, and/or weight based adjustment of the mA/kV was utilized to reduce the radiation dose to as low as reasonably achievable. COMPARISON: None available. CLINICAL HISTORY: Head trauma, minor (Age >= 65y). FINDINGS: BRAIN AND VENTRICLES: No acute hemorrhage. No evidence of acute infarct. Cerebral ventricle sizes are concordant with the degree of cerebral volume loss. Patchy and confluent areas of decreased attenuation are noted throughout the deep and periventricular white matter of the cerebral hemispheres bilaterally suggestive of chronic microvascular ischemic changes. No extra-axial collection. No mass effect or midline shift. Atherosclerotic calcifications in carotid siphons. ORBITS: No acute abnormality. SINUSES: Ethmoid air cell  and right maxillary sinus mucosal thickening. Bilateral mastoid effusions. Fluid within the bilateral middle ears. SOFT TISSUES AND SKULL: Right parietal scalp soft tissue swelling. No skull fracture. IMPRESSION: 1. No acute intracranial abnormality. 2. Right parietal scalp soft tissue edema. 3. Ethmoid air cell and right maxillary sinus mucosal thickening, bilateral mastoid effusions, and fluid within the bilateral middle ears. Electronically signed by: Morgane Naveau MD 09/11/2024 06:00 PM EST RP Workstation: [id number]   CT Cervical Spine Wo Contrast Result Date: 09/11/2024 EXAM: CT CERVICAL SPINE WITHOUT CONTRAST 09/11/2024 05:32:58 PM TECHNIQUE: CT of the cervical spine was performed without the administration of intravenous contrast. Multiplanar reformatted images are provided for review. Automated exposure control, iterative reconstruction, and/or weight based adjustment of the mA/kV was utilized to reduce the radiation dose to as low as reasonably achievable. COMPARISON: None available. 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 Similar finding of the posterior arch. Grade 1 anterolisthesis of C3 on C4 and C4 on C5. DEGENERATIVE CHANGES: Multilevel anterior osteophyte formation. Uncovertebral arthropathy as well as facet arthropathy. No severe osseous neural foraminal or central canal stenosis. SOFT TISSUES: No prevertebral soft tissue swelling. Atherosclerotic plaque. IMPRESSION: 1. No acute findings. Electronically signed by: Kate Plummer MD 09/11/2024 05:57 PM EST RP Workstation: [id number]      Assessment/Plan   Principal Problem:    Syncope   ***            ***                     ***                      ***                     ***                     ***                     ***                      ***                     ***                     ***                     ***                     ***                    ***                   ***  DVT prophylaxis: SCD's ***  Code Status: Full code*** Family Communication: none*** Disposition Plan: Per Rounding Team Consults called: none***;  Admission status: ***     I SPENT GREATER THAN 75 *** MINUTES IN CLINICAL CARE TIME/MEDICAL DECISION-MAKING IN COMPLETING THIS ADMISSION.      Eva NOVAK Bernadette Gores DO [facility]  From 7PM - 7AM   09/11/2024, 7:53 PM   ***     [1] No Known Allergies  "

## 2024-09-11 NOTE — ED Notes (Signed)
 Patient ambulated with minimal assistance. Patient respiration labored, but family reports this is baseline when ambulating. O2 sat 99% when returned to room.

## 2024-09-12 ENCOUNTER — Encounter (HOSPITAL_COMMUNITY): Payer: Self-pay | Admitting: Internal Medicine

## 2024-09-12 ENCOUNTER — Observation Stay (HOSPITAL_COMMUNITY)

## 2024-09-12 ENCOUNTER — Observation Stay (HOSPITAL_BASED_OUTPATIENT_CLINIC_OR_DEPARTMENT_OTHER)

## 2024-09-12 DIAGNOSIS — R7401 Elevation of levels of liver transaminase levels: Secondary | ICD-10-CM | POA: Diagnosis present

## 2024-09-12 DIAGNOSIS — R55 Syncope and collapse: Secondary | ICD-10-CM | POA: Diagnosis not present

## 2024-09-12 DIAGNOSIS — I951 Orthostatic hypotension: Secondary | ICD-10-CM | POA: Diagnosis not present

## 2024-09-12 DIAGNOSIS — E785 Hyperlipidemia, unspecified: Secondary | ICD-10-CM | POA: Diagnosis present

## 2024-09-12 DIAGNOSIS — N179 Acute kidney failure, unspecified: Secondary | ICD-10-CM | POA: Diagnosis not present

## 2024-09-12 DIAGNOSIS — Z8679 Personal history of other diseases of the circulatory system: Secondary | ICD-10-CM

## 2024-09-12 LAB — COMPREHENSIVE METABOLIC PANEL WITH GFR
ALT: 61 U/L — ABNORMAL HIGH (ref 0–44)
AST: 54 U/L — ABNORMAL HIGH (ref 15–41)
Albumin: 4.2 g/dL (ref 3.5–5.0)
Alkaline Phosphatase: 70 U/L (ref 38–126)
Anion gap: 14 (ref 5–15)
BUN: 25 mg/dL — ABNORMAL HIGH (ref 8–23)
CO2: 20 mmol/L — ABNORMAL LOW (ref 22–32)
Calcium: 9.3 mg/dL (ref 8.9–10.3)
Chloride: 106 mmol/L (ref 98–111)
Creatinine, Ser: 1.28 mg/dL — ABNORMAL HIGH (ref 0.61–1.24)
GFR, Estimated: 58 mL/min — ABNORMAL LOW
Glucose, Bld: 136 mg/dL — ABNORMAL HIGH (ref 70–99)
Potassium: 4.6 mmol/L (ref 3.5–5.1)
Sodium: 140 mmol/L (ref 135–145)
Total Bilirubin: 0.7 mg/dL (ref 0.0–1.2)
Total Protein: 6.7 g/dL (ref 6.5–8.1)

## 2024-09-12 LAB — HEMOGLOBIN A1C
Hgb A1c MFr Bld: 7.8 % — ABNORMAL HIGH (ref 4.8–5.6)
Mean Plasma Glucose: 177.16 mg/dL

## 2024-09-12 LAB — CBC WITH DIFFERENTIAL/PLATELET
Abs Immature Granulocytes: 0.04 K/uL (ref 0.00–0.07)
Basophils Absolute: 0 K/uL (ref 0.0–0.1)
Basophils Relative: 0 %
Eosinophils Absolute: 0 K/uL (ref 0.0–0.5)
Eosinophils Relative: 0 %
HCT: 50 % (ref 39.0–52.0)
Hemoglobin: 16.2 g/dL (ref 13.0–17.0)
Immature Granulocytes: 1 %
Lymphocytes Relative: 21 %
Lymphs Abs: 1.8 K/uL (ref 0.7–4.0)
MCH: 28.8 pg (ref 26.0–34.0)
MCHC: 32.4 g/dL (ref 30.0–36.0)
MCV: 88.8 fL (ref 80.0–100.0)
Monocytes Absolute: 1.5 K/uL — ABNORMAL HIGH (ref 0.1–1.0)
Monocytes Relative: 17 %
Neutro Abs: 5.3 K/uL (ref 1.7–7.7)
Neutrophils Relative %: 61 %
Platelets: 159 K/uL (ref 150–400)
RBC: 5.63 MIL/uL (ref 4.22–5.81)
RDW: 14.4 % (ref 11.5–15.5)
WBC: 8.7 K/uL (ref 4.0–10.5)
nRBC: 0 % (ref 0.0–0.2)

## 2024-09-12 LAB — ECHOCARDIOGRAM COMPLETE
Area-P 1/2: 4.24 cm2
S' Lateral: 2.4 cm

## 2024-09-12 LAB — MAGNESIUM: Magnesium: 2.3 mg/dL (ref 1.7–2.4)

## 2024-09-12 LAB — APTT: aPTT: 30 s (ref 24–36)

## 2024-09-12 LAB — PROTIME-INR
INR: 1.1 (ref 0.8–1.2)
Prothrombin Time: 15 s (ref 11.4–15.2)

## 2024-09-12 LAB — GLUCOSE, CAPILLARY
Glucose-Capillary: 139 mg/dL — ABNORMAL HIGH (ref 70–99)
Glucose-Capillary: 145 mg/dL — ABNORMAL HIGH (ref 70–99)
Glucose-Capillary: 191 mg/dL — ABNORMAL HIGH (ref 70–99)

## 2024-09-12 LAB — D-DIMER, QUANTITATIVE: D-Dimer, Quant: 0.93 ug{FEU}/mL — ABNORMAL HIGH (ref 0.00–0.50)

## 2024-09-12 LAB — CBG MONITORING, ED: Glucose-Capillary: 145 mg/dL — ABNORMAL HIGH (ref 70–99)

## 2024-09-12 LAB — CK: Total CK: 236 U/L (ref 49–397)

## 2024-09-12 MED ORDER — LACTATED RINGERS IV SOLN: INTRAVENOUS | Status: AC

## 2024-09-12 MED ORDER — INSULIN ASPART 100 UNIT/ML IJ SOLN
0.0000 [IU] | Freq: Three times a day (TID) | INTRAMUSCULAR | Status: DC
  Administered 2024-09-12: 1 [IU] via SUBCUTANEOUS
  Administered 2024-09-13 (×2): 2 [IU] via SUBCUTANEOUS
  Filled 2024-09-12 (×2): qty 2
  Filled 2024-09-12: qty 1

## 2024-09-12 MED ORDER — IOHEXOL 350 MG/ML SOLN
75.0000 mL | Freq: Once | INTRAVENOUS | Status: AC | PRN
  Administered 2024-09-12: 75 mL via INTRAVENOUS

## 2024-09-12 MED ORDER — AMLODIPINE BESYLATE 5 MG PO TABS
5.0000 mg | ORAL_TABLET | Freq: Every day | ORAL | Status: DC
  Administered 2024-09-12 – 2024-09-13 (×2): 5 mg via ORAL
  Filled 2024-09-12 (×2): qty 1

## 2024-09-12 NOTE — ED Notes (Signed)
 Patient transported to CT

## 2024-09-12 NOTE — Discharge Planning (Signed)
 Transition of Care [facility]) - Inpatient Brief Assessment   Patient Details  Name: Dustin Monroe MRN: [id number] Date of Birth: 1946/08/14  [facility] CM/SW Contact:    Debarah Saunas, RN Phone Number: 09/12/2024, 10:58 AM   Clinical Narrative: Transition of Care Department [facility]) has reviewed patient and no TOC needs have been identified at this time. We will continue to monitor patient advancement through interdisciplinary progression rounds. If new patient transition needs arise, please place a TOC consult.    Transition of Care Asessment: Insurance and Status: Insurance coverage has been reviewed Patient has primary care physician: Yes Home environment has been reviewed: from home with brothers Prior level of function:: independent; uses cane at times Prior/Current Home Services: No current home services Social Drivers of Health Review: SDOH reviewed no interventions necessary Readmission risk has been reviewed: Yes Transition of care needs: no transition of care needs at this time

## 2024-09-12 NOTE — Progress Notes (Signed)
 Echocardiogram 2D Echocardiogram has been performed.  Dustin Monroe 09/12/2024, 1:50 PM

## 2024-09-12 NOTE — Care Management Obs Status (Signed)
 MEDICARE OBSERVATION STATUS NOTIFICATION   Patient Details  Name: Dustin Monroe MRN: [id number] Date of Birth: 06/03/1946   Medicare Observation Status Notification Given:  Yes    Debarah Saunas, RN 09/12/2024, 10:53 AM
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
## 2024-09-12 NOTE — Plan of Care (Signed)

## 2024-09-12 NOTE — ED Notes (Signed)
"  Pt ambulatory to and from restroom with steady gait.   "

## 2024-09-12 NOTE — ED Notes (Signed)
 Floor notfied patient coming to floor.

## 2024-09-13 ENCOUNTER — Other Ambulatory Visit: Payer: Self-pay | Admitting: Cardiology

## 2024-09-13 DIAGNOSIS — E119 Type 2 diabetes mellitus without complications: Secondary | ICD-10-CM | POA: Diagnosis not present

## 2024-09-13 DIAGNOSIS — R7401 Elevation of levels of liver transaminase levels: Secondary | ICD-10-CM | POA: Diagnosis not present

## 2024-09-13 DIAGNOSIS — N179 Acute kidney failure, unspecified: Secondary | ICD-10-CM | POA: Diagnosis not present

## 2024-09-13 DIAGNOSIS — E872 Acidosis, unspecified: Secondary | ICD-10-CM | POA: Diagnosis not present

## 2024-09-13 DIAGNOSIS — R55 Syncope and collapse: Secondary | ICD-10-CM

## 2024-09-13 LAB — CBC
HCT: 48.3 % (ref 39.0–52.0)
Hemoglobin: 15.9 g/dL (ref 13.0–17.0)
MCH: 28.6 pg (ref 26.0–34.0)
MCHC: 32.9 g/dL (ref 30.0–36.0)
MCV: 86.9 fL (ref 80.0–100.0)
Platelets: 177 K/uL (ref 150–400)
RBC: 5.56 MIL/uL (ref 4.22–5.81)
RDW: 14.2 % (ref 11.5–15.5)
WBC: 6.1 K/uL (ref 4.0–10.5)
nRBC: 0 % (ref 0.0–0.2)

## 2024-09-13 LAB — BASIC METABOLIC PANEL WITH GFR
Anion gap: 14 (ref 5–15)
BUN: 25 mg/dL — ABNORMAL HIGH (ref 8–23)
CO2: 21 mmol/L — ABNORMAL LOW (ref 22–32)
Calcium: 9.5 mg/dL (ref 8.9–10.3)
Chloride: 105 mmol/L (ref 98–111)
Creatinine, Ser: 1.15 mg/dL (ref 0.61–1.24)
GFR, Estimated: >60 mL/min
Glucose, Bld: 166 mg/dL — ABNORMAL HIGH (ref 70–99)
Potassium: 4 mmol/L (ref 3.5–5.1)
Sodium: 140 mmol/L (ref 135–145)

## 2024-09-13 LAB — TSH: TSH: 3.15 u[IU]/mL (ref 0.350–4.500)

## 2024-09-13 LAB — GLUCOSE, CAPILLARY
Glucose-Capillary: 151 mg/dL — ABNORMAL HIGH (ref 70–99)
Glucose-Capillary: 169 mg/dL — ABNORMAL HIGH (ref 70–99)

## 2024-09-13 LAB — PHOSPHORUS: Phosphorus: 2.5 mg/dL (ref 2.5–4.6)
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
## 2024-09-13 NOTE — Progress Notes (Signed)
 30d monitor ordered for syncope. Dr. Kriste to read. Follow arranged in the office.

## 2024-09-19 NOTE — Discharge Summary (Signed)
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 for detection and/or diagnosis of SARS-CoV-2 by FDA under an Emergency Use Authorization (EUA). This EUA will remain in effect (meaning this test can be used) for the duration of the COVID-19 declaration under Section 564(b)(1) of the Act, 21 U.S.C. section 360bbb-3(b)(1), unless the authorization is terminated or revoked.  Performed at [facility], [address] [place], [place] [address]   Culture, blood (single)     Status: None   Collection Time: 08/22/21  1:29 AM   Specimen: BLOOD LEFT ARM  Result Value Ref Range Status   Specimen Description BLOOD LEFT ARM  Final   Special Requests   Final    BOTTLES DRAWN AEROBIC AND ANAEROBIC Blood Culture results may not be optimal due to an inadequate volume of blood received in culture bottles   Culture   Final    NO GROWTH 5 DAYS Performed  at [facility], [address] [place], [place] [address]    Report Status 08/27/2021 FINAL  Final     Labs: CBC: Recent Labs  Lab 09/13/24 0643  WBC 6.1  HGB 15.9  HCT 48.3  MCV 86.9  PLT 177   Basic Metabolic Panel: Recent Labs  Lab 09/13/24 0643  NA 140  K 4.0  CL 105  CO2 21*  GLUCOSE 166*  BUN 25*  CREATININE 1.15  CALCIUM  9.5  PHOS 2.5   Liver Function Tests: No results for input(s): AST, ALT, ALKPHOS, BILITOT, PROT, ALBUMIN in the last 168 hours. CBG: Recent Labs  Lab 09/12/24 1544 09/12/24 2122 09/13/24 0738 09/13/24 1201  GLUCAP 145* 191* 151* 169*    Discharge time spent: Time Coordinating Discharge: I spent a total of 35 minutes engaged in face-to-face discussion with the patient and/or caregivers regarding the patients care, assessment, plan, and discharge disposition. Over 50% of this time was dedicated to counseling the patient on the risks and benefits of treatment options and the discharge plan, as well as coordinating post-discharge care.   Signed: Janisha Bueso Al-Sultani, MD [facility] 09/19/2024         "

## 2024-10-15 ENCOUNTER — Ambulatory Visit: Admitting: Cardiology

## 2024-10-16 ENCOUNTER — Ambulatory Visit: Admitting: Nurse Practitioner

## 2024-10-16 ENCOUNTER — Telehealth: Payer: Self-pay | Admitting: *Deleted

## 2024-10-16 NOTE — Telephone Encounter (Signed)
 Call placed to pt regarding appointment and no monitor results.  Per pt, he didn't get to wear it long as it wouldn't charge.  Pt was advised to call the monitor company and have them send a replacement.  Pt did have the # and he verified that with me.  Pt advised to call us  back once he gets the new monitor so we can reschedule him to come back to the office.  He verbalized understanding.
